# Patient Record
Sex: Female | Born: 1990 | Race: White | Hispanic: No | Smoking: Never smoker
Health system: Southern US, Community
[De-identification: ages and names within clinical notes are randomized; demographics above are authoritative.]

## PROBLEM LIST (undated history)

## (undated) ENCOUNTER — Emergency Department (HOSPITAL_COMMUNITY): Admission: EM | Payer: BC Managed Care – PPO | Source: Home / Self Care

## (undated) DIAGNOSIS — F419 Anxiety disorder, unspecified: Secondary | ICD-10-CM

## (undated) DIAGNOSIS — E039 Hypothyroidism, unspecified: Secondary | ICD-10-CM

## (undated) DIAGNOSIS — O021 Missed abortion: Secondary | ICD-10-CM

## (undated) DIAGNOSIS — F32A Depression, unspecified: Secondary | ICD-10-CM

## (undated) DIAGNOSIS — O020 Blighted ovum and nonhydatidiform mole: Secondary | ICD-10-CM

## (undated) DIAGNOSIS — Z973 Presence of spectacles and contact lenses: Secondary | ICD-10-CM

---
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
## 2019-05-16 DIAGNOSIS — O039 Complete or unspecified spontaneous abortion without complication: Secondary | ICD-10-CM | POA: Insufficient documentation

## 2019-05-16 DIAGNOSIS — N925 Other specified irregular menstruation: Secondary | ICD-10-CM | POA: Diagnosis not present

## 2019-05-27 DIAGNOSIS — O09A0 Supervision of pregnancy with history of molar pregnancy, unspecified trimester: Secondary | ICD-10-CM

## 2019-05-27 HISTORY — DX: Supervision of pregnancy with history of molar pregnancy, unspecified trimester: O09.A0

## 2019-05-31 ENCOUNTER — Other Ambulatory Visit: Payer: Self-pay | Admitting: Obstetrics and Gynecology

## 2019-05-31 DIAGNOSIS — O039 Complete or unspecified spontaneous abortion without complication: Secondary | ICD-10-CM | POA: Diagnosis not present

## 2019-05-31 DIAGNOSIS — O3680X9 Pregnancy with inconclusive fetal viability, other fetus: Secondary | ICD-10-CM | POA: Diagnosis not present

## 2019-06-01 ENCOUNTER — Encounter (HOSPITAL_BASED_OUTPATIENT_CLINIC_OR_DEPARTMENT_OTHER): Payer: Self-pay | Admitting: *Deleted

## 2019-06-01 ENCOUNTER — Other Ambulatory Visit: Payer: Self-pay

## 2019-06-01 NOTE — Progress Notes (Addendum)
Spoke w/ pt via phone for pre-op interview.   Npo after mn.  Arrive at 0530.  Pt getting covid test done 06-02-2019.  LVM for stacy, or scheduler for dr Rogue Bussing, inquired about pt's ABO/Rh result to be faxed.  ADDENDUM:  Received ABO/Rh result dated 05-16-2019 via fax from dr callahan office, place in chart.

## 2019-06-02 ENCOUNTER — Other Ambulatory Visit (HOSPITAL_COMMUNITY)
Admission: RE | Admit: 2019-06-02 | Discharge: 2019-06-02 | Disposition: A | Discharge status: Home / Self Care | Payer: Medicaid Other | Source: Ambulatory Visit | Attending: Nurse Practitioner | Admitting: Nurse Practitioner

## 2019-06-02 DIAGNOSIS — Z20828 Contact with and (suspected) exposure to other viral communicable diseases: Secondary | ICD-10-CM | POA: Insufficient documentation

## 2019-06-02 DIAGNOSIS — Z01812 Encounter for preprocedural laboratory examination: Secondary | ICD-10-CM | POA: Diagnosis not present

## 2019-06-02 LAB — SARS CORONAVIRUS 2 (TAT 6-24 HRS): SARS Coronavirus 2: NEGATIVE

## 2019-06-05 NOTE — Anesthesia Preprocedure Evaluation (Addendum)
Anesthesia Evaluation  Patient identified by MRN, date of birth, ID band Patient awake    Reviewed: Allergy & Precautions, H&P , NPO status , Patient's Chart, lab work & pertinent test results  Airway Mallampati: I  TM Distance: >3 FB Neck ROM: Full    Dental no notable dental hx. (+) Teeth Intact, Dental Advisory Given   Pulmonary neg pulmonary ROS,    Pulmonary exam normal breath sounds clear to auscultation       Cardiovascular Exercise Tolerance: Good negative cardio ROS Normal cardiovascular exam Rhythm:Regular Rate:Normal     Neuro/Psych negative neurological ROS  negative psych ROS   GI/Hepatic negative GI ROS, Neg liver ROS,   Endo/Other  negative endocrine ROS  Renal/GU negative Renal ROS  negative genitourinary   Musculoskeletal negative musculoskeletal ROS (+)   Abdominal   Peds negative pediatric ROS (+)  Hematology negative hematology ROS (+)   Anesthesia Other Findings   Reproductive/Obstetrics negative OB ROS                            Anesthesia Physical Anesthesia Plan  ASA: I  Anesthesia Plan: General   Post-op Pain Management:    Induction: Intravenous  PONV Risk Score and Plan: 3 and Ondansetron, Dexamethasone, Treatment may vary due to age or medical condition and Midazolam  Airway Management Planned: LMA and Oral ETT  Additional Equipment:   Intra-op Plan:   Post-operative Plan:   Informed Consent: I have reviewed the patients History and Physical, chart, labs and discussed the procedure including the risks, benefits and alternatives for the proposed anesthesia with the patient or authorized representative who has indicated his/her understanding and acceptance.       Plan Discussed with: CRNA, Surgeon and Anesthesiologist  Anesthesia Plan Comments: ( )       Anesthesia Quick Evaluation

## 2019-06-06 ENCOUNTER — Ambulatory Visit (HOSPITAL_BASED_OUTPATIENT_CLINIC_OR_DEPARTMENT_OTHER): Payer: Medicaid Other | Admitting: Anesthesiology

## 2019-06-06 ENCOUNTER — Encounter (HOSPITAL_BASED_OUTPATIENT_CLINIC_OR_DEPARTMENT_OTHER): Payer: Self-pay | Admitting: Emergency Medicine

## 2019-06-06 ENCOUNTER — Encounter (HOSPITAL_BASED_OUTPATIENT_CLINIC_OR_DEPARTMENT_OTHER)
Admission: RE | Disposition: A | Discharge status: Home / Self Care | Payer: Self-pay | Source: Home / Self Care | Attending: Obstetrics and Gynecology

## 2019-06-06 ENCOUNTER — Other Ambulatory Visit: Payer: Self-pay

## 2019-06-06 ENCOUNTER — Ambulatory Visit (HOSPITAL_BASED_OUTPATIENT_CLINIC_OR_DEPARTMENT_OTHER)
Admission: RE | Admit: 2019-06-06 | Discharge: 2019-06-06 | Disposition: A | Discharge status: Home / Self Care | Payer: Medicaid Other | Attending: Obstetrics and Gynecology | Admitting: Obstetrics and Gynecology

## 2019-06-06 DIAGNOSIS — O021 Missed abortion: Secondary | ICD-10-CM | POA: Insufficient documentation

## 2019-06-06 DIAGNOSIS — O039 Complete or unspecified spontaneous abortion without complication: Secondary | ICD-10-CM | POA: Diagnosis not present

## 2019-06-06 DIAGNOSIS — O0389 Complete or unspecified spontaneous abortion with other complications: Secondary | ICD-10-CM | POA: Diagnosis not present

## 2019-06-06 HISTORY — DX: Missed abortion: O02.1

## 2019-06-06 HISTORY — DX: Presence of spectacles and contact lenses: Z97.3

## 2019-06-06 HISTORY — PX: DILATION AND EVACUATION: SHX1459

## 2019-06-06 SURGERY — DILATION AND EVACUATION, UTERUS
Anesthesia: General | Site: Uterus

## 2019-06-06 MED ORDER — FENTANYL CITRATE (PF) 100 MCG/2ML IJ SOLN
25.0000 ug | INTRAMUSCULAR | Status: DC | PRN
  Filled 2019-06-06: qty 1

## 2019-06-06 MED ORDER — MIDAZOLAM HCL 5 MG/5ML IJ SOLN
INTRAMUSCULAR | Status: DC | PRN
  Administered 2019-06-06: 2 mg via INTRAVENOUS

## 2019-06-06 MED ORDER — OXYCODONE-ACETAMINOPHEN 5-325 MG PO TABS: 1.0000 | ORAL_TABLET | Freq: Four times a day (QID) | ORAL | 0 refills | Status: DC | PRN

## 2019-06-06 MED ORDER — OXYCODONE HCL 5 MG PO TABS
5.0000 mg | ORAL_TABLET | Freq: Once | ORAL | Status: AC | PRN
  Administered 2019-06-06: 5 mg via ORAL
  Filled 2019-06-06: qty 1

## 2019-06-06 MED ORDER — MIDAZOLAM HCL 2 MG/2ML IJ SOLN
INTRAMUSCULAR | Status: AC
  Filled 2019-06-06: qty 2

## 2019-06-06 MED ORDER — FENTANYL CITRATE (PF) 100 MCG/2ML IJ SOLN
INTRAMUSCULAR | Status: DC | PRN
  Administered 2019-06-06: 25 ug via INTRAVENOUS
  Administered 2019-06-06: 50 ug via INTRAVENOUS
  Administered 2019-06-06: 25 ug via INTRAVENOUS

## 2019-06-06 MED ORDER — LACTATED RINGERS IV SOLN
INTRAVENOUS | Status: DC
  Administered 2019-06-06 (×2): via INTRAVENOUS
  Filled 2019-06-06: qty 1000

## 2019-06-06 MED ORDER — ONDANSETRON HCL 4 MG/2ML IJ SOLN
INTRAMUSCULAR | Status: AC
  Filled 2019-06-06: qty 2

## 2019-06-06 MED ORDER — OXYCODONE HCL 5 MG/5ML PO SOLN
5.0000 mg | Freq: Once | ORAL | Status: AC | PRN
  Filled 2019-06-06: qty 5

## 2019-06-06 MED ORDER — LIDOCAINE 2% (20 MG/ML) 5 ML SYRINGE
INTRAMUSCULAR | Status: AC
  Filled 2019-06-06: qty 5

## 2019-06-06 MED ORDER — ARTIFICIAL TEARS OPHTHALMIC OINT
TOPICAL_OINTMENT | OPHTHALMIC | Status: AC
  Filled 2019-06-06: qty 3.5

## 2019-06-06 MED ORDER — OXYCODONE HCL 5 MG PO TABS
ORAL_TABLET | ORAL | Status: AC
  Filled 2019-06-06: qty 1

## 2019-06-06 MED ORDER — DOXYCYCLINE HYCLATE 100 MG PO TABS: 100.0000 mg | ORAL_TABLET | Freq: Once | ORAL | 0 refills | Status: AC

## 2019-06-06 MED ORDER — DEXAMETHASONE SODIUM PHOSPHATE 4 MG/ML IJ SOLN
INTRAMUSCULAR | Status: DC | PRN
  Administered 2019-06-06: 8 mg via INTRAVENOUS

## 2019-06-06 MED ORDER — KETOROLAC TROMETHAMINE 30 MG/ML IJ SOLN
INTRAMUSCULAR | Status: DC | PRN
  Administered 2019-06-06: 30 mg via INTRAVENOUS

## 2019-06-06 MED ORDER — ONDANSETRON HCL 4 MG/2ML IJ SOLN
4.0000 mg | Freq: Once | INTRAMUSCULAR | Status: DC | PRN
  Filled 2019-06-06: qty 2

## 2019-06-06 MED ORDER — OXYCODONE-ACETAMINOPHEN 5-325 MG PO TABS
1.0000 | ORAL_TABLET | Freq: Four times a day (QID) | ORAL | Status: DC | PRN
  Filled 2019-06-06: qty 2

## 2019-06-06 MED ORDER — LIDOCAINE 2% (20 MG/ML) 5 ML SYRINGE
INTRAMUSCULAR | Status: DC | PRN
  Administered 2019-06-06: 100 mg via INTRAVENOUS

## 2019-06-06 MED ORDER — ACETAMINOPHEN 325 MG PO TABS
325.0000 mg | ORAL_TABLET | ORAL | Status: DC | PRN
  Filled 2019-06-06: qty 2

## 2019-06-06 MED ORDER — DOXYCYCLINE HYCLATE 100 MG IV SOLR
200.0000 mg | Freq: Two times a day (BID) | INTRAVENOUS | Status: DC
  Administered 2019-06-06: 200 mg via INTRAVENOUS
  Filled 2019-06-06 (×2): qty 200

## 2019-06-06 MED ORDER — KETOROLAC TROMETHAMINE 30 MG/ML IJ SOLN
INTRAMUSCULAR | Status: AC
  Filled 2019-06-06: qty 1

## 2019-06-06 MED ORDER — PROPOFOL 10 MG/ML IV BOLUS
INTRAVENOUS | Status: DC | PRN
  Administered 2019-06-06: 200 mg via INTRAVENOUS

## 2019-06-06 MED ORDER — DOXYCYCLINE HYCLATE 100 MG PO TABS
100.0000 mg | ORAL_TABLET | Freq: Once | ORAL | Status: DC
  Filled 2019-06-06: qty 1

## 2019-06-06 MED ORDER — DEXAMETHASONE SODIUM PHOSPHATE 10 MG/ML IJ SOLN
INTRAMUSCULAR | Status: AC
  Filled 2019-06-06: qty 1

## 2019-06-06 MED ORDER — LIDOCAINE HCL 1 % IJ SOLN
INTRAMUSCULAR | Status: DC | PRN
  Administered 2019-06-06: 10 mL

## 2019-06-06 MED ORDER — ACETAMINOPHEN 160 MG/5ML PO SOLN
325.0000 mg | ORAL | Status: DC | PRN
  Filled 2019-06-06: qty 20.3

## 2019-06-06 MED ORDER — MEPERIDINE HCL 25 MG/ML IJ SOLN
6.2500 mg | INTRAMUSCULAR | Status: DC | PRN
  Filled 2019-06-06: qty 1

## 2019-06-06 MED ORDER — ONDANSETRON HCL 4 MG/2ML IJ SOLN
INTRAMUSCULAR | Status: DC | PRN
  Administered 2019-06-06: 4 mg via INTRAVENOUS

## 2019-06-06 MED ORDER — PROPOFOL 10 MG/ML IV BOLUS
INTRAVENOUS | Status: AC
  Filled 2019-06-06: qty 20

## 2019-06-06 MED ORDER — FENTANYL CITRATE (PF) 100 MCG/2ML IJ SOLN
INTRAMUSCULAR | Status: AC
  Filled 2019-06-06: qty 2

## 2019-06-06 SURGICAL SUPPLY — 24 items
CATH ROBINSON RED A/P 16FR (CATHETERS) ×3 IMPLANT
CLOTH BEACON ORANGE TIMEOUT ST (SAFETY) ×3 IMPLANT
COVER WAND RF STERILE (DRAPES) IMPLANT
DECANTER SPIKE VIAL GLASS SM (MISCELLANEOUS) IMPLANT
ELECT REM PT RETURN 9FT ADLT (ELECTROSURGICAL)
ELECTRODE REM PT RTRN 9FT ADLT (ELECTROSURGICAL) IMPLANT
FILTER UTR ASPR ASSEMBLY (MISCELLANEOUS) ×3 IMPLANT
GLOVE BIOGEL PI IND STRL 6.5 (GLOVE) ×1 IMPLANT
GLOVE BIOGEL PI IND STRL 7.0 (GLOVE) ×1 IMPLANT
GLOVE BIOGEL PI INDICATOR 6.5 (GLOVE) ×2
GLOVE BIOGEL PI INDICATOR 7.0 (GLOVE) ×2
GLOVE ECLIPSE 6.5 STRL STRAW (GLOVE) ×3 IMPLANT
GOWN STRL REUS W/TWL LRG LVL3 (GOWN DISPOSABLE) ×6 IMPLANT
KIT BERKELEY 1ST TRIMESTER 3/8 (MISCELLANEOUS) ×3 IMPLANT
NS IRRIG 500ML POUR BTL (IV SOLUTION) ×3 IMPLANT
PACK VAGINAL MINOR WOMEN LF (CUSTOM PROCEDURE TRAY) ×3 IMPLANT
PAD OB MATERNITY 4.3X12.25 (PERSONAL CARE ITEMS) ×3 IMPLANT
PAD PREP 24X48 CUFFED NSTRL (MISCELLANEOUS) ×3 IMPLANT
SET BERKELEY SUCTION TUBING (SUCTIONS) ×3 IMPLANT
TOWEL OR 17X26 10 PK STRL BLUE (TOWEL DISPOSABLE) ×6 IMPLANT
VACURETTE 10 RIGID CVD (CANNULA) IMPLANT
VACURETTE 7MM CVD STRL WRAP (CANNULA) IMPLANT
VACURETTE 8 RIGID CVD (CANNULA) IMPLANT
VACURETTE 9 RIGID CVD (CANNULA) ×3 IMPLANT

## 2019-06-06 NOTE — Transfer of Care (Signed)
  Last Vitals:  Vitals Value Taken Time  BP 125/91 06/06/19 0823  Temp    Pulse 90 06/06/19 0825  Resp 18 06/06/19 0825  SpO2 100 % 06/06/19 0825  Vitals shown include unvalidated device data.  Last Pain:  Vitals:   06/06/19 0602  TempSrc: Oral      Patients Stated Pain Goal: 4 (06/06/19 0602)  Immediate Anesthesia Transfer of Care Note  Patient: Betty Chung  Procedure(s) Performed: Procedure(s) (LRB): DILATATION AND EVACUATION (N/A)  Patient Location: PACU  Anesthesia Type: General  Level of Consciousness: awake, alert  and oriented  Airway & Oxygen Therapy: Patient Spontanous Breathing and Patient connected to nasal cannula oxygen  Post-op Assessment: Report given to PACU RN and Post -op Vital signs reviewed and stable  Post vital signs: Reviewed and stable  Complications: No apparent anesthesia complications

## 2019-06-06 NOTE — Discharge Instructions (Signed)
DISCHARGE INSTRUCTIONS:  D&E The following instructions have been prepared to help you care for yourself upon your return home.   Personal hygiene:  Use sanitary pads for vaginal drainage, not tampons.  Shower the day after your procedure.  NO tub baths, pools or Jacuzzis for 2-3 weeks.  Wipe front to back after using the bathroom.  Activity and limitations:  Do NOT drive or operate any equipment for 24 hours. The effects of anesthesia are still present and drowsiness may result.  Do NOT rest in bed all day.  Walking is encouraged.  Walk up and down stairs slowly.  You may resume your normal activity in one to two days or as indicated by your physician.  Sexual activity: NO intercourse for at least 2 weeks after the procedure, or as indicated by your physician.  Diet: Eat a light meal as desired this evening. You may resume your usual diet tomorrow.  Return to work: You may resume your work activities in one to two days or as indicated by your doctor.  What to expect after your surgery: Expect to have vaginal bleeding/discharge for 2-3 days and spotting for up to 10 days. It is not unusual to have soreness for up to 1-2 weeks. You may have a slight burning sensation when you urinate for the first day. Mild cramps may continue for a couple of days. You may have a regular period in 2-6 weeks.  Call your doctor for any of the following:  Excessive vaginal bleeding, saturating and changing one pad every hour.  Inability to urinate 6 hours after discharge from hospital.  Pain not relieved by pain medication.  Fever of 100.4 F or greater.  Unusual vaginal discharge or odor.    Post Anesthesia Home Care Instructions  Activity: Get plenty of rest for the remainder of the day. A responsible individual must stay with you for 24 hours following the procedure.  For the next 24 hours, DO NOT: -Drive a car -Paediatric nurse -Drink alcoholic beverages -Take any medication  unless instructed by your physician -Make any legal decisions or sign important papers.  Meals: Start with liquid foods such as gelatin or soup. Progress to regular foods as tolerated. Avoid greasy, spicy, heavy foods. If nausea and/or vomiting occur, drink only clear liquids until the nausea and/or vomiting subsides. Call your physician if vomiting continues.  Special Instructions/Symptoms: Your throat may feel dry or sore from the anesthesia or the breathing tube placed in your throat during surgery. If this causes discomfort, gargle with warm salt water. The discomfort should disappear within 24 hours.    Do not take any nonsteroidal anti inflammatories until after 4:00 pm today.

## 2019-06-06 NOTE — Op Note (Signed)
06/06/2019  8:22 AM  PATIENT:  Betty Chung  28 y.o. female  PRE-OPERATIVE DIAGNOSIS:  Miscarriage [redacted]w[redacted]d  POST-OPERATIVE DIAGNOSIS:  Miscarriage [redacted]w[redacted]d  PROCEDURE:  Procedure(s): DILATATION AND EVACUATION (N/A)  SURGEON:  Surgeon(s) and Role:    Rogue Bussing, Casimer Bilis, DO - Primary  ANESTHESIA:   local and general   FINDINGS: abundant evacuation of blood, clot and POC EBL:  400 mL   LOCAL MEDICATIONS USED:  LIDOCAINE  and Amount: 10 ml  SPECIMEN:  Source of Specimen:  RPOC  DISPOSITION OF SPECIMEN:  PATHOLOGY  COUNTS:  YES  PLAN OF CARE: Discharge to home after PACU  PATIENT DISPOSITION:  PACU - hemodynamically stable.   DESCRIPTION OF PROCEDURE: Pt was taken to the operating room where anesthesia was administered and found to be adequate.  A speculum was placed in the vagina and a single tooth tenaculum was used to grasp the anterior lip of the cervix.  The cervix was gently dilated to 29 pratt and a 9 suction curette gently advanced to fundus.  Two passed were performed with greater than expected return of blood and clot which clogged the collection system and new catchment containers needed.  A metal curette was advanced and curettage of all fur quadrants performed.  A final pass the the suction curette was performed.  No further bleeding noted from cervical os.  The tenaculum was removed and hemostasis assured.  All instruments were removed.  Pt tolerated the procedure well.   Delay start of Pharmacological VTE agent (>24hrs) due to surgical blood loss or risk of bleeding: not applicable

## 2019-06-06 NOTE — Anesthesia Postprocedure Evaluation (Signed)
Anesthesia Post Note  Patient: Betty Chung  Procedure(s) Performed: DILATATION AND EVACUATION (N/A Uterus)     Patient location during evaluation: PACU Anesthesia Type: General Level of consciousness: awake and alert Pain management: pain level controlled Vital Signs Assessment: post-procedure vital signs reviewed and stable Respiratory status: spontaneous breathing, nonlabored ventilation, respiratory function stable and patient connected to nasal cannula oxygen Cardiovascular status: blood pressure returned to baseline and stable Postop Assessment: no apparent nausea or vomiting Anesthetic complications: no    Last Vitals:  Vitals:   06/06/19 0855 06/06/19 0955  BP: 111/78 108/68  Pulse: 63 69  Resp: 13 16  Temp:  36.9 C  SpO2: 100% 100%    Last Pain:  Vitals:   06/06/19 0955  TempSrc:   PainSc: 3                  ,

## 2019-06-06 NOTE — Brief Op Note (Signed)
06/06/2019  8:16 AM  PATIENT:  Jacky Kindle  28 y.o. female  PRE-OPERATIVE DIAGNOSIS:  Miscarriage [redacted]w[redacted]d  POST-OPERATIVE DIAGNOSIS:  Miscarriage [redacted]w[redacted]d  PROCEDURE:  Procedure(s): DILATATION AND EVACUATION (N/A)  SURGEON:  Surgeon(s) and Role:    Rogue Bussing, Casimer Bilis, DO - Primary  ANESTHESIA:   local and general   FINDINGS: abundant evacuation of blood, clot and POC EBL:  400 mL   LOCAL MEDICATIONS USED:  LIDOCAINE  and Amount: 10 ml  SPECIMEN:  Source of Specimen:  RPOC  DISPOSITION OF SPECIMEN:  PATHOLOGY  COUNTS:  YES  PLAN OF CARE: Discharge to home after PACU  PATIENT DISPOSITION:  PACU - hemodynamically stable.   Delay start of Pharmacological VTE agent (>24hrs) due to surgical blood loss or risk of bleeding: not applicable

## 2019-06-06 NOTE — H&P (Signed)
28 y.o. G2P0020 at 8w3 with MAB.  At initial visit at 6 weeks, scan showed +FHT but irregular GS.  She experienced spotting thereafter. F/u scan [redacted]w[redacted]d no FHT. Options discussed, patient opted for D&E. Blood type A+.  Past Medical History:  Diagnosis Date  . Missed ab   . Wears glasses    Past Surgical History:  Procedure Laterality Date  . KNEE SURGERY Right 2007   mass removed from under patella (per pt benign)  . WISDOM TOOTH EXTRACTION  2011    Social History   Socioeconomic History  . Marital status: Single    Spouse name: Not on file  . Number of children: Not on file  . Years of education: Not on file  . Highest education level: Not on file  Occupational History  . Not on file  Social Needs  . Financial resource strain: Not on file  . Food insecurity    Worry: Not on file    Inability: Not on file  . Transportation needs    Medical: Not on file    Non-medical: Not on file  Tobacco Use  . Smoking status: Never Smoker  . Smokeless tobacco: Never Used  Substance and Sexual Activity  . Alcohol use: Not Currently  . Drug use: Never  . Sexual activity: Not on file  Lifestyle  . Physical activity    Days per week: Not on file    Minutes per session: Not on file  . Stress: Not on file  Relationships  . Social Herbalist on phone: Not on file    Gets together: Not on file    Attends religious service: Not on file    Active member of club or organization: Not on file    Attends meetings of clubs or organizations: Not on file    Relationship status: Not on file  . Intimate partner violence    Fear of current or ex partner: Not on file    Emotionally abused: Not on file    Physically abused: Not on file    Forced sexual activity: Not on file  Other Topics Concern  . Not on file  Social History Narrative  . Not on file    No current facility-administered medications on file prior to encounter.    Current Outpatient Medications on File Prior to  Encounter  Medication Sig Dispense Refill  . Prenatal Multivit-Min-Fe-FA (PRE-NATAL FORMULA PO) Take by mouth daily.      No Known Allergies  Vitals:   06/01/19 1759 06/06/19 0602  BP:  114/77  Pulse:  73  Resp:  16  Temp:  98.1 F (36.7 C)  TempSrc:  Oral  SpO2:  100%  Weight: 70.3 kg 71.7 kg  Height: 5\' 4"  (1.626 m) 5\' 4"  (1.626 m)   In office: Lungs: clear to ascultation Cor:  RRR Abdomen:  soft, nontender, nondistended. Ex:  no cords, erythema Pelvic: deferred to OR  A:  Admitted for scheduled D&E for MAB.   P: P: All risks, benefits and alternatives d/w patient and she desires to proceed.   !V doxy ordered, pt will received RX for oral dose at home post op Other routine pre-op care Allyn Kenner

## 2019-06-06 NOTE — Anesthesia Procedure Notes (Signed)
Procedure Name: LMA Insertion Date/Time: 06/06/2019 7:38 AM Performed by: Janeece Riggers, MD Pre-anesthesia Checklist: Patient identified, Emergency Drugs available, Suction available and Patient being monitored Patient Re-evaluated:Patient Re-evaluated prior to induction Oxygen Delivery Method: Circle system utilized Preoxygenation: Pre-oxygenation with 100% oxygen Induction Type: IV induction Ventilation: Mask ventilation without difficulty LMA: LMA inserted LMA Size: 4.0 Number of attempts: 1 Airway Equipment and Method: Bite block Placement Confirmation: positive ETCO2 Tube secured with: Tape Dental Injury: Teeth and Oropharynx as per pre-operative assessment

## 2019-06-07 ENCOUNTER — Encounter (HOSPITAL_BASED_OUTPATIENT_CLINIC_OR_DEPARTMENT_OTHER): Payer: Self-pay | Admitting: Obstetrics and Gynecology

## 2019-06-08 DIAGNOSIS — O020 Blighted ovum and nonhydatidiform mole: Secondary | ICD-10-CM | POA: Insufficient documentation

## 2019-06-13 DIAGNOSIS — O020 Blighted ovum and nonhydatidiform mole: Secondary | ICD-10-CM | POA: Diagnosis not present

## 2019-06-20 DIAGNOSIS — O020 Blighted ovum and nonhydatidiform mole: Secondary | ICD-10-CM | POA: Diagnosis not present

## 2019-06-27 DIAGNOSIS — O020 Blighted ovum and nonhydatidiform mole: Secondary | ICD-10-CM | POA: Diagnosis not present

## 2019-06-28 ENCOUNTER — Encounter (HOSPITAL_COMMUNITY): Payer: Self-pay | Admitting: Obstetrics and Gynecology

## 2019-07-04 DIAGNOSIS — O039 Complete or unspecified spontaneous abortion without complication: Secondary | ICD-10-CM | POA: Diagnosis not present

## 2019-07-10 ENCOUNTER — Encounter (HOSPITAL_COMMUNITY): Payer: Self-pay | Admitting: Obstetrics and Gynecology

## 2019-07-11 DIAGNOSIS — O039 Complete or unspecified spontaneous abortion without complication: Secondary | ICD-10-CM | POA: Diagnosis not present

## 2019-07-18 DIAGNOSIS — O039 Complete or unspecified spontaneous abortion without complication: Secondary | ICD-10-CM | POA: Diagnosis not present

## 2019-07-25 DIAGNOSIS — O039 Complete or unspecified spontaneous abortion without complication: Secondary | ICD-10-CM | POA: Diagnosis not present

## 2019-08-01 DIAGNOSIS — O039 Complete or unspecified spontaneous abortion without complication: Secondary | ICD-10-CM | POA: Diagnosis not present

## 2019-08-08 DIAGNOSIS — O020 Blighted ovum and nonhydatidiform mole: Secondary | ICD-10-CM | POA: Diagnosis not present

## 2019-08-15 DIAGNOSIS — O039 Complete or unspecified spontaneous abortion without complication: Secondary | ICD-10-CM | POA: Diagnosis not present

## 2019-08-22 DIAGNOSIS — O039 Complete or unspecified spontaneous abortion without complication: Secondary | ICD-10-CM | POA: Diagnosis not present

## 2019-08-29 DIAGNOSIS — O039 Complete or unspecified spontaneous abortion without complication: Secondary | ICD-10-CM | POA: Diagnosis not present

## 2019-09-05 DIAGNOSIS — O039 Complete or unspecified spontaneous abortion without complication: Secondary | ICD-10-CM | POA: Diagnosis not present

## 2019-09-08 ENCOUNTER — Other Ambulatory Visit: Payer: Self-pay | Admitting: Obstetrics and Gynecology

## 2019-09-13 ENCOUNTER — Other Ambulatory Visit (HOSPITAL_COMMUNITY): Payer: Self-pay | Admitting: Obstetrics and Gynecology

## 2019-09-13 ENCOUNTER — Other Ambulatory Visit: Payer: Self-pay | Admitting: Obstetrics and Gynecology

## 2019-09-13 DIAGNOSIS — N912 Amenorrhea, unspecified: Secondary | ICD-10-CM

## 2019-09-18 ENCOUNTER — Other Ambulatory Visit: Payer: Self-pay

## 2019-09-18 ENCOUNTER — Ambulatory Visit (HOSPITAL_COMMUNITY)
Admission: RE | Admit: 2019-09-18 | Discharge: 2019-09-18 | Disposition: A | Discharge status: Home / Self Care | Payer: Medicaid Other | Source: Ambulatory Visit | Attending: Obstetrics and Gynecology | Admitting: Obstetrics and Gynecology

## 2019-09-18 DIAGNOSIS — N912 Amenorrhea, unspecified: Secondary | ICD-10-CM | POA: Diagnosis not present

## 2019-09-20 ENCOUNTER — Telehealth: Payer: Self-pay | Admitting: *Deleted

## 2019-09-20 NOTE — Telephone Encounter (Signed)
Erline Levine from [facility] called and scheduled an appt and will contact the patient

## 2019-10-02 ENCOUNTER — Inpatient Hospital Stay: Payer: No Typology Code available for payment source | Attending: Gynecologic Oncology | Admitting: Gynecologic Oncology

## 2019-10-02 ENCOUNTER — Other Ambulatory Visit: Payer: Self-pay

## 2019-10-02 ENCOUNTER — Encounter: Payer: Self-pay | Admitting: Gynecologic Oncology

## 2019-10-02 VITALS — BP 117/68 | HR 80 | Temp 98.5°F | Resp 16 | Ht 64.0 in | Wt 163.5 lb

## 2019-10-02 DIAGNOSIS — N939 Abnormal uterine and vaginal bleeding, unspecified: Secondary | ICD-10-CM | POA: Diagnosis not present

## 2019-10-02 DIAGNOSIS — O020 Blighted ovum and nonhydatidiform mole: Secondary | ICD-10-CM | POA: Diagnosis not present

## 2019-10-02 DIAGNOSIS — I770 Arteriovenous fistula, acquired: Secondary | ICD-10-CM | POA: Diagnosis not present

## 2019-10-02 DIAGNOSIS — Q2730 Arteriovenous malformation, site unspecified: Secondary | ICD-10-CM | POA: Diagnosis not present

## 2019-10-02 DIAGNOSIS — N912 Amenorrhea, unspecified: Secondary | ICD-10-CM | POA: Diagnosis not present

## 2019-10-02 NOTE — Patient Instructions (Signed)
The pleasure meeting you today.  You can present to your OB/GYN's office anytime during business hours today or tomorrow to get your pregnancy hormone test repeated.  We will follow up with you on these results.  I will call you with the results of the MRI.  If we are unable to perform the MRI because of insurance reasons, then we will get the special pelvic ultrasound to measure the flow within the abnormal connection between the arteries and veins in your uterus.  Please not hesitate to call our clinic with any questions at [phone].

## 2019-10-02 NOTE — Progress Notes (Addendum)
GYNECOLOGIC ONCOLOGY NEW PATIENT CONSULTATION   Patient Name: Betty Chung  Patient Age: 28 y.o. 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  1. Molar pregnancy: had plateau of her hcg over 2 weeks (3 values), but she does not meet criteria for GTD. I have recommended that she get a repeat beta-hCG level, since her last level was a month ago, and would prefer that she has this done at her OB/GYN's office so that the values are consistent across the same lab.  We called the office and the patient can come in anytime during business hours to have this drawn.  She will need continued surveillance of her hCG until it normalizes.  2.  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  We reviewed what this diagnosis means and the concern for possible significant hemorrhage.  I suspect that if this is truly an AVM, it is acquired or iatrogenic secondary to her recent pregnancy and uterine procedure.  While there are a limited number of cases of uterine AVM reported in the literature, we discussed the possibility of observation or conservative treatment with embolization.  According to a recent review, peak systolic velocity within the AVM was found to be helpful in predicting its natural history, with a PSV less than or equal to 40 cm/s identifying AVMs that may be more likely to regress without intervention.  I spoke with our radiologist and unfortunately as the exam was not ordered with Doppler, this is not something they routinely assess.  I have thus recommended proceeding with a pelvic MRI to try to better characterize this  uterine lesion.  If an MRI is not covered by insurance or confirms the finding of an AVM, then we will proceed with repeat pelvic ultrasound with Doppler to assess the peak systolic velocity to help guide treatment.  3.  Birth control: I have strongly recommended that the patient use birth control as we work up this abnormal finding on her ultrasound.  She voiced understanding of the importance of using condoms if not on another form of hormonal or nonhormonal birth control.  References: Sridhar D et al. diagnosis and treatment of uterine and pelvic arteriovenous malformations.  Endovascular today.  January 2018.  A copy of this note was sent to the patient's referring provider.   Eugene Garnet, MD  Division of Gynecologic Oncology  Department of Obstetrics and Gynecology  [facility]  ___________________________________________  Chief Complaint: Chief Complaint  Patient presents with  . Molar pregnancy    History of Present Illness:  Betty Chung is a lovely 28 y.o. y.o. female who is seen in consultation at the request of Lewis Moccasin, MD for an evaluation of a possible arteriovenous fistula (AVM) of the uterus.  The patient underwent D&C for missed abortion on 06/06/2019 with final pathology showing a molar pregnancy.  She had heavy bleeding immediately after surgery and then intermittent vaginal spotting since.  She has been followed with serial hCGs, the last of which was approximately 1 month ago.  Given the plateau of hCG levels over several weeks, the patient underwent pelvic ultrasound with findings concerning for an AVM.  The patient overall is  doing well.  Her last menstrual period was the week before Thanksgiving.  She notes that since her D&C, her menses have been and unchanged in terms of length and quantity of bleeding compared to prior to her most recent pregnancy.  Her last period was more painful; she notes that typically she has  cramping requiring the use of Midol for 1 to 2 days at the start of her menses but this most recent menses used Midol for the entire duration of bleeding.  She is currently sexually active since her procedure and is not using birth control.  She strongly desires to maintain fertility.  PAST MEDICAL HISTORY:  Past Medical History:  Diagnosis Date  . Missed ab   . Molar pregnancy   . Wears glasses      PAST SURGICAL HISTORY:  Past Surgical History:  Procedure Laterality Date  . DILATION AND EVACUATION N/A 06/06/2019   Procedure: DILATATION AND EVACUATION;  Surgeon: Allyn Kenner, DO;  Location: [facility];  Service: Gynecology;  Laterality: N/A;  . KNEE SURGERY Right 2007   mass removed from under patella (per pt benign)  . WISDOM TOOTH EXTRACTION  2011    OB/GYN HISTORY:  OB History  Gravida Para Term Preterm AB Living  2 0     2    SAB TAB Ectopic Multiple Live Births               # Outcome Date GA Lbr Len/2nd Weight Sex Delivery Anes PTL Lv  2 AB           1 AB             Obstetric Comments  Last pap: 10/26/16  Hx of abnormal pap: yes, in 2017, repeat normal  History of STIs: denies  Age at menarche: 79    Last LMP: 11/11 Used Sprintec pills previously but has not in years  MEDICATIONS: Outpatient Encounter Medications as of 10/02/2019  Medication Sig  . Prenatal Multivit-Min-Fe-FA (PRE-NATAL FORMULA PO) Take by mouth daily.  . [DISCONTINUED] oxyCODONE-acetaminophen (PERCOCET/ROXICET) 5-325 MG tablet Take 1-2 tablets by mouth every 6 (six) hours as needed for severe pain.   No facility-administered encounter medications on file as of 10/02/2019.     ALLERGIES:  Allergies  Allergen Reactions  . No Allergies On File      FAMILY HISTORY:  Family History  Problem Relation Age of Onset  . Uterine cancer Paternal Grandmother      SOCIAL HISTORY:  Relationships  Social connections  . Talks on phone: Not on file  . Gets together: Not on file   . Attends religious service: Not on file  . Active member of club or organization: Not on file  . Attends meetings of clubs or organizations: Not on file  . Relationship status: Not on file    REVIEW OF SYSTEMS:  Denies appetite changes, fevers, chills, fatigue, unexplained weight changes. Denies hearing loss, neck lumps or masses, mouth sores, ringing in ears or voice changes. Denies cough or wheezing.  Denies shortness of breath. Denies chest pain or palpitations. Denies leg swelling. Denies abdominal distention, pain, blood in stools, constipation, diarrhea, nausea, vomiting, or early satiety. Denies pain with intercourse, dysuria, frequency, hematuria or incontinence. Denies hot flashes, pelvic pain, vaginal bleeding or vaginal discharge.   Denies joint pain, back pain or muscle pain/cramps. Denies itching, rash, or wounds. Denies dizziness, headaches, numbness or seizures. Denies swollen lymph nodes or glands, denies easy bruising or bleeding. Denies anxiety,  depression, confusion, or decreased concentration.   Physical Exam:  Vital Signs for this encounter:  Blood pressure 117/68, pulse 80, temperature 98.5 F (36.9 C), temperature source Temporal, resp. rate 16, height 5\' 4"  (1.626 m), weight 163 lb 8 oz (74.2 kg), SpO2 100 %. Body mass index is 28.06 kg/m. General: Alert, oriented, no acute distress.  HEENT: Normocephalic, atraumatic. Sclera anicteric.  Chest: Clear to auscultation bilaterally.  Cardiovascular: Regular rate and rhythm, no murmurs, rubs, or gallops.  Abdomen: Normoactive bowel sounds. Soft, nondistended, nontender to palpation. No masses or hepatosplenomegaly appreciated. No palpable fluid wave.  Extremities: Grossly normal range of motion. Warm, well perfused. No edema bilaterally.  Skin: No rashes or lesions.  Lymphatics: No cervical, supraclavicular, or inguinal adenopathy.  GU:  Normal external female genitalia.   No lesions. No discharge or bleeding.              Bladder/urethra:  No lesions or masses, well supported bladder             Vagina: Rugated vaginal mucosa, no lesions or masses.             Cervix: Normal appearing, no lesions.             Uterus: Small, mobile, no nodularity.             Adnexa: No masses.   LABORATORY AND RADIOLOGIC DATA:  Outside medical records were reviewed to synthesize the above history, along with the history and physical obtained during the visit.   Pathology from products of conception, 8/11: Chorionic villi with features of molar gestation, favor complete  Pelvic ultrasound on 09/18/2019: FINDINGS: Uterus Measurements: 9.9 x 3.2 x 4.6 cm = volume: 75.4 mL. Heterogeneous somewhat tubular and echogenic area within the left anterior uterine wall measuring 1.5 x 1.3 x 1.4 cm, containing prominent vascularity. Endometrium Thickness: 6.8 mm.  No focal abnormality visualized. Right ovary Measurements: 2.3 x 1.1 x 1.6 cm = volume: 2.1 mL. Normal appearance/no adnexal mass. Left ovary Measurements: 3.3 x 2.3 x 2.6 cm = volume: 10.5 mL. Normal appearance/no adnexal mass. Other findings:  No abnormal free fluid. IMPRESSION: 1. Heterogenous slightly echogenic area of altered echotexture within the left anterior uterus measuring up to 1.5 cm and containing significant vascularity. 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 Further evaluation with pelvic MRI is recommended. 2. The adnexa are within normal limits.  Labs: hcg 11/11: 13 11/4: 14 10/28: 15 10/21: 23 10/14: 23 10/7: 31 9/30: 66 9/23: 86 9/16: 147 9/9: 210 9/2: 258 8/26: 343 8/19: 1451

## 2019-10-04 ENCOUNTER — Encounter: Payer: Self-pay | Admitting: Gynecologic Oncology

## 2019-10-04 ENCOUNTER — Telehealth: Payer: Self-pay | Admitting: Gynecologic Oncology

## 2019-10-04 NOTE — Telephone Encounter (Signed)
Called the patient to review results from her beta-hCG at outside facility yesterday.  Her hCG level was 12.  She has now had plateau of her hCG over 4 values and about a month and a half, meeting criteria for gestational trophoblastic disease.  We discussed this diagnosis and typical treatment.  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  We discussed treatment options including dilation and curettage and chemotherapy.  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  I think it would be reasonable to continue close surveillance of her hCG and delay intervention unless her hCG value starts climbing versus treat with chemotherapy.  We discussed the use of methotrexate (would plan 5-day regimen) and the likely short treatment course given her already low hCG and what I assume would be a very quick normalization of this level.  We discussed expectations for surveillance after treatment as well as possibility for future pregnancy.  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  All the patient's questions were answered.  I sent information to her via MyChart regarding GTD as well as methotrexate for her review.  Her mutually decided plan was that I will call her with the MRI results which is scheduled for the 15th and we will decide at that point whether she would like to continue close surveillance or start methotrexate treatment.  Jeral Pinch MD Gynecologic Oncology

## 2019-10-10 ENCOUNTER — Other Ambulatory Visit: Payer: Self-pay

## 2019-10-10 ENCOUNTER — Ambulatory Visit (HOSPITAL_COMMUNITY)
Admission: RE | Admit: 2019-10-10 | Discharge: 2019-10-10 | Disposition: A | Discharge status: Home / Self Care | Payer: Medicaid Other | Source: Ambulatory Visit | Attending: Gynecologic Oncology | Admitting: Gynecologic Oncology

## 2019-10-10 DIAGNOSIS — N939 Abnormal uterine and vaginal bleeding, unspecified: Secondary | ICD-10-CM | POA: Insufficient documentation

## 2019-10-10 DIAGNOSIS — N83202 Unspecified ovarian cyst, left side: Secondary | ICD-10-CM | POA: Diagnosis not present

## 2019-10-10 MED ORDER — GADOBUTROL 1 MMOL/ML IV SOLN
7.0000 mL | Freq: Once | INTRAVENOUS | Status: AC | PRN
  Administered 2019-10-10: 7 mL via INTRAVENOUS

## 2019-10-11 ENCOUNTER — Telehealth: Payer: Self-pay | Admitting: Gynecologic Oncology

## 2019-10-11 DIAGNOSIS — O020 Blighted ovum and nonhydatidiform mole: Secondary | ICD-10-CM | POA: Diagnosis not present

## 2019-10-11 NOTE — Telephone Encounter (Signed)
Called and reviewed MRI findings with the patient.  Discussed no AVM on MRI and fundal area on the uterus likely adenomyosis versus small fibroid.  Also discussed benign-appearing ovarian cyst.  Reviewed treatment options including close surveillance of hCG versus chemotherapy at this time.  I think it would be reasonable now to also consider a D&C given no AVM.  I spoke with multiple colleagues regarding the patient's case and consensus was for erring on the side of close surveillance and trending beta-hCG weekly.  If hCG starts rising, then at this point would consider D&C and/or chemotherapy with methotrexate.  Discussed risks and benefits of each plan and patient voices being comfortable with decision to continue with close surveillance.  My office will call her primary GYN's office to let them know she will come in weekly on Wednesday, which is convenient for her before work, to get a hCG drawn.  We will ask them to fax [place] these results.  Jeral Pinch MD Gynecologic Oncology

## 2019-10-12 ENCOUNTER — Telehealth: Payer: Self-pay | Admitting: Gynecologic Oncology

## 2019-10-12 NOTE — Telephone Encounter (Signed)
Received fax with hCG value from 12/16, which was 5.  Called Ziyana with this news.  Since this is technically within the normal range, I discussed following hCG levels weekly until 3 normal or negative values and then transitioning to monthly checks likely for total of 6 months before she would be released to try to get pregnant again.  Jeral Pinch MD Gynecologic Oncology

## 2019-10-18 DIAGNOSIS — O020 Blighted ovum and nonhydatidiform mole: Secondary | ICD-10-CM | POA: Diagnosis not present

## 2019-10-23 ENCOUNTER — Encounter: Payer: Self-pay | Admitting: Gynecologic Oncology

## 2019-10-25 DIAGNOSIS — O020 Blighted ovum and nonhydatidiform mole: Secondary | ICD-10-CM | POA: Diagnosis not present

## 2019-10-26 ENCOUNTER — Encounter: Payer: Self-pay | Admitting: Gynecologic Oncology

## 2019-11-16 ENCOUNTER — Encounter: Payer: Self-pay | Admitting: Gynecologic Oncology

## 2019-12-14 ENCOUNTER — Encounter: Payer: Self-pay | Admitting: Gynecologic Oncology

## 2020-01-10 ENCOUNTER — Encounter: Payer: Self-pay | Admitting: Gynecologic Oncology

## 2020-01-12 ENCOUNTER — Encounter: Payer: Self-pay | Admitting: Gynecologic Oncology

## 2020-02-08 ENCOUNTER — Encounter: Payer: Self-pay | Admitting: Gynecologic Oncology

## 2020-03-14 ENCOUNTER — Encounter: Payer: Self-pay | Admitting: Gynecologic Oncology

## 2020-04-11 ENCOUNTER — Encounter: Payer: Self-pay | Admitting: Gynecologic Oncology

## 2020-05-09 ENCOUNTER — Encounter: Payer: Self-pay | Admitting: Gynecologic Oncology

## 2020-05-13 DIAGNOSIS — N925 Other specified irregular menstruation: Secondary | ICD-10-CM | POA: Diagnosis not present

## 2020-05-16 DIAGNOSIS — N925 Other specified irregular menstruation: Secondary | ICD-10-CM | POA: Diagnosis not present

## 2020-05-17 ENCOUNTER — Encounter: Payer: Self-pay | Admitting: Gynecologic Oncology

## 2020-05-17 DIAGNOSIS — O26841 Uterine size-date discrepancy, first trimester: Secondary | ICD-10-CM | POA: Diagnosis not present

## 2020-05-20 ENCOUNTER — Encounter: Payer: Self-pay | Admitting: Gynecologic Oncology

## 2020-05-31 ENCOUNTER — Encounter: Payer: Self-pay | Admitting: Gynecologic Oncology

## 2020-05-31 DIAGNOSIS — O3680X9 Pregnancy with inconclusive fetal viability, other fetus: Secondary | ICD-10-CM | POA: Diagnosis not present

## 2020-05-31 DIAGNOSIS — Z124 Encounter for screening for malignant neoplasm of cervix: Secondary | ICD-10-CM | POA: Diagnosis not present

## 2020-06-04 DIAGNOSIS — N925 Other specified irregular menstruation: Secondary | ICD-10-CM | POA: Diagnosis not present

## 2020-06-13 DIAGNOSIS — O3680X9 Pregnancy with inconclusive fetal viability, other fetus: Secondary | ICD-10-CM | POA: Diagnosis not present

## 2020-06-13 DIAGNOSIS — O021 Missed abortion: Secondary | ICD-10-CM | POA: Diagnosis not present

## 2020-06-14 ENCOUNTER — Other Ambulatory Visit (HOSPITAL_COMMUNITY)
Admission: RE | Admit: 2020-06-14 | Discharge: 2020-06-14 | Disposition: A | Discharge status: Home / Self Care | Payer: Medicaid Other | Source: Ambulatory Visit | Attending: Obstetrics and Gynecology | Admitting: Obstetrics and Gynecology

## 2020-06-14 ENCOUNTER — Other Ambulatory Visit: Payer: Self-pay

## 2020-06-14 ENCOUNTER — Encounter (HOSPITAL_BASED_OUTPATIENT_CLINIC_OR_DEPARTMENT_OTHER): Payer: Self-pay | Admitting: Obstetrics and Gynecology

## 2020-06-14 DIAGNOSIS — Z01812 Encounter for preprocedural laboratory examination: Secondary | ICD-10-CM | POA: Diagnosis not present

## 2020-06-14 DIAGNOSIS — Z20822 Contact with and (suspected) exposure to covid-19: Secondary | ICD-10-CM | POA: Insufficient documentation

## 2020-06-14 LAB — SARS CORONAVIRUS 2 (TAT 6-24 HRS): SARS Coronavirus 2: NEGATIVE

## 2020-06-14 NOTE — Progress Notes (Signed)
Spoke w/ via phone for pre-op interview--- PT Lab needs dos----  CBC, T&S             Lab results------ previous ABO/Rh results w/ chart COVID test ------ 06-14-2020 @ 1415 Arrive at ------- 1045 NPO after MN NO Solid Food.  Clear liquids from MN until--- 0945 Medications to take morning of surgery ----- NONE Diabetic medication ----- n/a Patient Special Instructions ----- n/a Pre-Op special Istructions ----- n/a Patient verbalized understanding of instructions that were given at this phone interview. Patient denies shortness of breath, chest pain, fever, cough at this phone interview.

## 2020-06-14 NOTE — H&P (Addendum)
29 y.o. G2P0020 presents for scheduled D&E for MAB.  Pt had MAB at 6 weeks 01/2019, then 05/2019: another MAB with D&E POC showing chorionic villi with molar features- suspected partial mole. Recommended  weekly BHCG until normal for 3 weeks, then monthly x 1 reliable BC for of treatment period.  Plateau of BHCG noted on 13th lab draw. Discussed with gyn-onc 08/30/2019: Dr. Andrey Farmer advised ok to repeat BHCG for 2 additional weeks, if still in low teens would recommend repeat US if + products would repeat D&C if neg can still do D&C and option of referral to her.  US showing hypervasculaer 1.5cm heterogenous tubular area within Left anterior wall measuring 1.5cm in largest dimension. Pt then saw Dr. Pricilla Holm 10/02/2019 and recommendation was continued following of BHCG: then 5, 2, <1, <1, <1, <1, <1, <1, the 05/09/2020: 34, 554 and 1312. [place] requested by gyn onc, initiall [redacted]w[redacted]d FHT @108 , f/u scan [redacted]w[redacted]d No FHT.  Past Medical History:  Diagnosis Date  . Missed ab   . Molar pregnancy   . Wears glasses    Past Surgical History:  Procedure Laterality Date  . DILATION AND EVACUATION N/A 06/06/2019   Procedure: DILATATION AND EVACUATION;  Surgeon: 08/06/2019, DO;  Location: [facility];  Service: Gynecology;  Laterality: N/A;  . KNEE SURGERY Right 2007   mass removed from under patella (per pt benign)  . WISDOM TOOTH EXTRACTION  2011    Social History   Socioeconomic History  . Marital status: Single    Spouse name: Not on file  . Number of children: Not on file  . Years of education: Not on file  . Highest education level: Not on file  Occupational History  . Not on file  Tobacco Use  . Smoking status: Never Smoker  . Smokeless tobacco: Never Used  Vaping Use  . Vaping Use: Never used  Substance and Sexual Activity  . Alcohol use: Not Currently  . Drug use: Never  . Sexual activity: Yes    Birth control/protection: None  Other Topics Concern  . Not on file  Social History  Narrative  . Not on file   Social Determinants of Health   Financial Resource Strain:   . Difficulty of Paying Living Expenses: Not on file  Food Insecurity:   . Worried About 2012 in the Last Year: Not on file  . Ran Out of Food in the Last Year: Not on file  Transportation Needs:   . Lack of Transportation (Medical): Not on file  . Lack of Transportation (Non-Medical): Not on file  Physical Activity:   . Days of Exercise per Week: Not on file  . Minutes of Exercise per Session: Not on file  Stress:   . Feeling of Stress : Not on file  Social Connections:   . Frequency of Communication with Friends and Family: Not on file  . Frequency of Social Gatherings with Friends and Family: Not on file  . Attends Religious Services: Not on file  . Active Member of Clubs or Organizations: Not on file  . Attends Programme researcher, broadcasting/film/video Meetings: Not on file  . Marital Status: Not on file  Intimate Partner Violence:   . Fear of Current or Ex-Partner: Not on file  . Emotionally Abused: Not on file  . Physically Abused: Not on file  . Sexually Abused: Not on file    No current facility-administered medications on file prior to encounter.   Current Outpatient Medications  on File Prior to Encounter  Medication Sig Dispense Refill  . Prenatal Multivit-Min-Fe-FA (PRE-NATAL FORMULA PO) Take by mouth daily.      Allergies  Allergen Reactions  . No Allergies On File     There were no vitals filed for this visit.  Lungs: clear to ascultation Cor:  RRR Abdomen:  soft, nontender, nondistended. Ex:  no cords, erythema Pelvic:  Deferred to OR  A:  MAB [redacted]w[redacted]d with recent h.o molar pregnancy 2020, did not complete surveillance course prior to becoming pregnant.   P:  All risks, benefits and alternatives d/w patient and she desires to proceed with D&E. IV doxy pre-op SCD T&S with CBC Other routine care  Philip Aspen   There has been no change in the patients history,  status or exam since the history and physical.  Vitals:   06/14/20 1050 06/18/20 1134  BP:  106/66  Resp:  20  Temp:  97.9 F (36.6 C)  TempSrc:  Oral  SpO2:  96%  Weight: 71.7 kg 72.5 kg  Height: 5\' 4"  (1.626 m) 5\' 4"  (1.626 m)    No results found for this or any previous visit (from the past 72 hour(s)).  

## 2020-06-18 ENCOUNTER — Encounter (HOSPITAL_BASED_OUTPATIENT_CLINIC_OR_DEPARTMENT_OTHER)
Admission: RE | Disposition: A | Discharge status: Home / Self Care | Payer: Self-pay | Source: Home / Self Care | Attending: Obstetrics and Gynecology

## 2020-06-18 ENCOUNTER — Other Ambulatory Visit: Payer: Self-pay

## 2020-06-18 ENCOUNTER — Ambulatory Visit (HOSPITAL_BASED_OUTPATIENT_CLINIC_OR_DEPARTMENT_OTHER): Payer: Medicaid Other | Admitting: Anesthesiology

## 2020-06-18 ENCOUNTER — Encounter (HOSPITAL_BASED_OUTPATIENT_CLINIC_OR_DEPARTMENT_OTHER): Payer: Self-pay | Admitting: Obstetrics and Gynecology

## 2020-06-18 ENCOUNTER — Ambulatory Visit (HOSPITAL_BASED_OUTPATIENT_CLINIC_OR_DEPARTMENT_OTHER)
Admission: RE | Admit: 2020-06-18 | Discharge: 2020-06-18 | Disposition: A | Discharge status: Home / Self Care | Payer: Medicaid Other | Attending: Obstetrics and Gynecology | Admitting: Obstetrics and Gynecology

## 2020-06-18 DIAGNOSIS — O021 Missed abortion: Secondary | ICD-10-CM | POA: Diagnosis not present

## 2020-06-18 DIAGNOSIS — Z8759 Personal history of other complications of pregnancy, childbirth and the puerperium: Secondary | ICD-10-CM | POA: Insufficient documentation

## 2020-06-18 HISTORY — PX: HYSTEROSCOPY WITH D & C: SHX1775

## 2020-06-18 LAB — TYPE AND SCREEN
ABO/RH(D): A POS
Antibody Screen: NEGATIVE

## 2020-06-18 LAB — CBC
HCT: 38.7 % (ref 36.0–46.0)
Hemoglobin: 13.3 g/dL (ref 12.0–15.0)
MCH: 30.3 pg (ref 26.0–34.0)
MCHC: 34.4 g/dL (ref 30.0–36.0)
MCV: 88.2 fL (ref 80.0–100.0)
Platelets: 228 10*3/uL (ref 150–400)
RBC: 4.39 MIL/uL (ref 3.87–5.11)
RDW: 12.2 % (ref 11.5–15.5)
WBC: 6.5 10*3/uL (ref 4.0–10.5)
nRBC: 0 % (ref 0.0–0.2)

## 2020-06-18 LAB — ABO/RH: ABO/RH(D): A POS

## 2020-06-18 SURGERY — DILATATION AND CURETTAGE /HYSTEROSCOPY
Anesthesia: General | Site: Vagina

## 2020-06-18 MED ORDER — ONDANSETRON HCL 4 MG/2ML IJ SOLN
INTRAMUSCULAR | Status: AC
  Filled 2020-06-18: qty 2

## 2020-06-18 MED ORDER — OXYCODONE HCL 5 MG PO TABS
5.0000 mg | ORAL_TABLET | Freq: Once | ORAL | Status: AC | PRN
  Administered 2020-06-18: 5 mg via ORAL

## 2020-06-18 MED ORDER — FENTANYL CITRATE (PF) 100 MCG/2ML IJ SOLN
INTRAMUSCULAR | Status: AC
  Filled 2020-06-18: qty 2

## 2020-06-18 MED ORDER — DEXAMETHASONE SODIUM PHOSPHATE 10 MG/ML IJ SOLN
INTRAMUSCULAR | Status: DC | PRN
  Administered 2020-06-18: 8 mg via INTRAVENOUS

## 2020-06-18 MED ORDER — LIDOCAINE 2% (20 MG/ML) 5 ML SYRINGE
INTRAMUSCULAR | Status: AC
  Filled 2020-06-18: qty 5

## 2020-06-18 MED ORDER — LIDOCAINE HCL 1 % IJ SOLN
INTRAMUSCULAR | Status: DC | PRN
  Administered 2020-06-18: 600 mg via INTRADERMAL

## 2020-06-18 MED ORDER — DOXYCYCLINE HYCLATE 100 MG PO TABS: 100.0000 mg | ORAL_TABLET | Freq: Once | ORAL | Status: DC

## 2020-06-18 MED ORDER — DEXAMETHASONE SODIUM PHOSPHATE 10 MG/ML IJ SOLN
INTRAMUSCULAR | Status: AC
  Filled 2020-06-18: qty 1

## 2020-06-18 MED ORDER — OXYCODONE HCL 5 MG PO TABS
ORAL_TABLET | ORAL | Status: AC
  Filled 2020-06-18: qty 1

## 2020-06-18 MED ORDER — MIDAZOLAM HCL 5 MG/5ML IJ SOLN
INTRAMUSCULAR | Status: DC | PRN
  Administered 2020-06-18: 2 mg via INTRAVENOUS

## 2020-06-18 MED ORDER — DOXYCYCLINE HYCLATE 100 MG PO TABS: ORAL_TABLET | ORAL | 0 refills | Status: DC

## 2020-06-18 MED ORDER — FENTANYL CITRATE (PF) 100 MCG/2ML IJ SOLN: 25.0000 ug | INTRAMUSCULAR | Status: DC | PRN

## 2020-06-18 MED ORDER — LIDOCAINE HCL 1 % IJ SOLN
INTRAMUSCULAR | Status: DC | PRN
  Administered 2020-06-18: 9 mL

## 2020-06-18 MED ORDER — FENTANYL CITRATE (PF) 100 MCG/2ML IJ SOLN
INTRAMUSCULAR | Status: DC | PRN
  Administered 2020-06-18: 25 ug via INTRAVENOUS
  Administered 2020-06-18: 50 ug via INTRAVENOUS
  Administered 2020-06-18: 25 ug via INTRAVENOUS

## 2020-06-18 MED ORDER — ONDANSETRON HCL 4 MG/2ML IJ SOLN
INTRAMUSCULAR | Status: DC | PRN
  Administered 2020-06-18: 4 mg via INTRAVENOUS

## 2020-06-18 MED ORDER — SODIUM CHLORIDE 0.9 % IV SOLN
100.0000 mg | Freq: Once | INTRAVENOUS | Status: AC
  Administered 2020-06-18: 100 mg via INTRAVENOUS
  Filled 2020-06-18 (×2): qty 100

## 2020-06-18 MED ORDER — LACTATED RINGERS IV SOLN: INTRAVENOUS | Status: DC

## 2020-06-18 MED ORDER — ACETAMINOPHEN 500 MG PO TABS: 1000.0000 mg | ORAL_TABLET | Freq: Once | ORAL | Status: DC | PRN

## 2020-06-18 MED ORDER — ACETAMINOPHEN 10 MG/ML IV SOLN: 1000.0000 mg | Freq: Once | INTRAVENOUS | Status: DC | PRN

## 2020-06-18 MED ORDER — ACETAMINOPHEN 160 MG/5ML PO SOLN: 1000.0000 mg | Freq: Once | ORAL | Status: DC | PRN

## 2020-06-18 MED ORDER — PROPOFOL 10 MG/ML IV BOLUS
INTRAVENOUS | Status: AC
  Filled 2020-06-18: qty 20

## 2020-06-18 MED ORDER — OXYCODONE HCL 5 MG/5ML PO SOLN: 5.0000 mg | Freq: Once | ORAL | Status: AC | PRN

## 2020-06-18 MED ORDER — PROPOFOL 10 MG/ML IV BOLUS
INTRAVENOUS | Status: DC | PRN
  Administered 2020-06-18: 200 mg via INTRAVENOUS

## 2020-06-18 MED ORDER — MIDAZOLAM HCL 2 MG/2ML IJ SOLN
INTRAMUSCULAR | Status: AC
  Filled 2020-06-18: qty 2

## 2020-06-18 SURGICAL SUPPLY — 25 items
CATH ROBINSON RED A/P 16FR (CATHETERS) ×3 IMPLANT
CLOTH BEACON ORANGE TIMEOUT ST (SAFETY) ×3 IMPLANT
COVER WAND RF STERILE (DRAPES) ×3 IMPLANT
DECANTER SPIKE VIAL GLASS SM (MISCELLANEOUS) ×3 IMPLANT
ELECT REM PT RETURN 9FT ADLT (ELECTROSURGICAL)
ELECTRODE REM PT RTRN 9FT ADLT (ELECTROSURGICAL) IMPLANT
FILTER UTR ASPR ASSEMBLY (MISCELLANEOUS) ×3 IMPLANT
GLOVE BIOGEL PI IND STRL 6.5 (GLOVE) ×1 IMPLANT
GLOVE BIOGEL PI IND STRL 7.0 (GLOVE) ×1 IMPLANT
GLOVE BIOGEL PI INDICATOR 6.5 (GLOVE) ×2
GLOVE BIOGEL PI INDICATOR 7.0 (GLOVE) ×2
GLOVE ECLIPSE 6.5 STRL STRAW (GLOVE) ×3 IMPLANT
GOWN STRL REUS W/TWL LRG LVL3 (GOWN DISPOSABLE) ×3 IMPLANT
KIT BERKELEY 1ST TRI 3/8 NO TR (MISCELLANEOUS) ×3 IMPLANT
KIT BERKELEY 1ST TRIMESTER 3/8 (MISCELLANEOUS) ×3 IMPLANT
NS IRRIG 500ML POUR BTL (IV SOLUTION) ×3 IMPLANT
PACK VAGINAL MINOR WOMEN LF (CUSTOM PROCEDURE TRAY) ×3 IMPLANT
PAD OB MATERNITY 4.3X12.25 (PERSONAL CARE ITEMS) ×3 IMPLANT
PAD PREP 24X48 CUFFED NSTRL (MISCELLANEOUS) ×3 IMPLANT
SET BERKELEY SUCTION TUBING (SUCTIONS) ×3 IMPLANT
TOWEL OR 17X26 10 PK STRL BLUE (TOWEL DISPOSABLE) ×3 IMPLANT
VACURETTE 10 RIGID CVD (CANNULA) IMPLANT
VACURETTE 7MM CVD STRL WRAP (CANNULA) ×3 IMPLANT
VACURETTE 8 RIGID CVD (CANNULA) IMPLANT
VACURETTE 9 RIGID CVD (CANNULA) IMPLANT

## 2020-06-18 NOTE — Anesthesia Procedure Notes (Signed)
Procedure Name: LMA Insertion Date/Time: 06/18/2020 12:47 PM Performed by: Garth Bigness, CRNA Pre-anesthesia Checklist: Patient identified, Emergency Drugs available, Suction available, Patient being monitored and Timeout performed Patient Re-evaluated:Patient Re-evaluated prior to induction Oxygen Delivery Method: Circle system utilized Preoxygenation: Pre-oxygenation with 100% oxygen Induction Type: IV induction Ventilation: Mask ventilation without difficulty LMA: LMA inserted LMA Size: 4.0 Number of attempts: 1 Placement Confirmation: positive ETCO2 Tube secured with: Tape Dental Injury: Teeth and Oropharynx as per pre-operative assessment

## 2020-06-18 NOTE — Progress Notes (Signed)
Patient awake, alert and oriented after procedure. Dr Rogue Bussing had discussed the addition of chromosomal studies prior to surgery according to the patient. Specimen was collected in OR. Anora kit consent signed in phase 2 by patient. Copy on chart.

## 2020-06-18 NOTE — Anesthesia Procedure Notes (Signed)
Performed by: Flynn-Cook,  A, CRNA       

## 2020-06-18 NOTE — Transfer of Care (Signed)
Immediate Anesthesia Transfer of Care Note  Patient: Betty Chung  Procedure(s) Performed: DILATATION AND CURETTAGE /HYSTEROSCOPY (N/A )  Patient Location: PACU  Anesthesia Type:General  Level of Consciousness: awake, alert , oriented and patient cooperative  Airway & Oxygen Therapy: Patient Spontanous Breathing and Patient connected to nasal cannula oxygen  Post-op Assessment: Report given to RN and Post -op Vital signs reviewed and stable  Post vital signs: Reviewed and stable  Last Vitals:  Vitals Value Taken Time  BP 116/70 06/18/20 1320  Temp    Pulse 78 06/18/20 1320  Resp 16 06/18/20 1322  SpO2 100 % 06/18/20 1320  Vitals shown include unvalidated device data.  Last Pain:  Vitals:   06/18/20 1134  TempSrc: Oral  PainSc: 2       Patients Stated Pain Goal: 6 (06/18/20 1134)  Complications: No complications documented.

## 2020-06-18 NOTE — Anesthesia Preprocedure Evaluation (Addendum)
Anesthesia Evaluation  Patient identified by MRN, date of birth, ID band Patient awake    Reviewed: Allergy & Precautions, NPO status , Patient's Chart, lab work & pertinent test results  History of Anesthesia Complications Negative for: history of anesthetic complications  Airway Mallampati: I  TM Distance: >3 FB Neck ROM: Full    Dental  (+) Dental Advisory Given, Teeth Intact   Pulmonary neg pulmonary ROS, neg recent URI,  Covid-19 Nucleic Acid Test Results Lab Results      Component                Value               Date                      SARSCOV2NAA              NEGATIVE            06/14/2020                SARSCOV2NAA              NEGATIVE            06/02/2019              breath sounds clear to auscultation       Cardiovascular negative cardio ROS   Rhythm:Regular     Neuro/Psych negative neurological ROS  negative psych ROS   GI/Hepatic negative GI ROS, Neg liver ROS,   Endo/Other  negative endocrine ROS  Renal/GU negative Renal ROS     Musculoskeletal negative musculoskeletal ROS (+)   Abdominal   Peds  Hematology negative hematology ROS (+)   Anesthesia Other Findings   Reproductive/Obstetrics                            Anesthesia Physical Anesthesia Plan  ASA: I  Anesthesia Plan: General   Post-op Pain Management:    Induction: Intravenous  PONV Risk Score and Plan: 3 and Ondansetron and Dexamethasone  Airway Management Planned: LMA and Oral ETT  Additional Equipment: None  Intra-op Plan:   Post-operative Plan:   Informed Consent: I have reviewed the patients History and Physical, chart, labs and discussed the procedure including the risks, benefits and alternatives for the proposed anesthesia with the patient or authorized representative who has indicated his/her understanding and acceptance.     Dental advisory given  Plan Discussed with: CRNA and  Surgeon  Anesthesia Plan Comments:         Anesthesia Quick Evaluation

## 2020-06-18 NOTE — Discharge Instructions (Signed)

## 2020-06-18 NOTE — Op Note (Signed)
06/18/2020  1:48 PM  PATIENT:  Betty Chung  29 y.o. female  PRE-OPERATIVE DIAGNOSIS:  Missed abortion 6wk  POST-OPERATIVE DIAGNOSIS:  Missed abortion 6wk  PROCEDURE:  Procedure(s): DILATATION AND EVACUATION (N/A) CHROMOSOME STUDIES (N/A)  SURGEON:  Surgeon(s) and Role:    Rogue Bussing, , DO - Primary   ANESTHESIA:   local and MAC  EBL:  10 mL   BLOOD ADMINISTERED:none  LOCAL MEDICATIONS USED:  LIDOCAINE  and Amount: 9 ml  DESCRIPTION OF PROCEDURE: Patient was taken to the OR where anesthesia was administered and found to be adequate.  Patient was prepped and draped in normal sterile fashion in dorsal lithotomy position.  A speculum was placed and anterior lip of cervix grasped with single tooth tenaculum.  Lidocaine paracervical block was placed and cervix was serially dilated to accommodate a curved 20m suction curette.  It was gently advanced to the fundus.  Proper suction confirmed and two passes performed with moderate POC evacuated.  A final pass with sharp curette to confirm complete evacuation was performed with no additional products obtained.  Tenaculum was removed and excellent hemostasis at tenaculum sites and cervical os noted.  All instruments were removed.  Patient tolerated the procedure well.  Sponge, lap and needle counts correct x 2.  Patient taken to recovery in stable condition.   SPECIMEN:  Source of Specimen:  POC  DISPOSITION OF SPECIMEN:  PATHOLOGY  COUNTS:  YES  PLAN OF CARE: Discharge to home after PACU  PATIENT DISPOSITION:  PACU - hemodynamically stable.   Delay start of Pharmacological VTE agent (>24hrs) due to surgical blood loss or risk of bleeding: not applicable

## 2020-06-19 ENCOUNTER — Encounter (HOSPITAL_BASED_OUTPATIENT_CLINIC_OR_DEPARTMENT_OTHER): Payer: Self-pay | Admitting: Obstetrics and Gynecology

## 2020-06-19 LAB — SURGICAL PATHOLOGY

## 2020-06-21 NOTE — Anesthesia Postprocedure Evaluation (Signed)
Anesthesia Post Note  Patient: Betty Chung  Procedure(s) Performed: DILATATION AND EVACUATION (N/A Vagina ) CHROMOSOME STUDIES (N/A Vagina )     Patient location during evaluation: PACU Anesthesia Type: General Level of consciousness: awake and alert Pain management: pain level controlled Vital Signs Assessment: post-procedure vital signs reviewed and stable Respiratory status: spontaneous breathing, nonlabored ventilation, respiratory function stable and patient connected to nasal cannula oxygen Cardiovascular status: blood pressure returned to baseline and stable Postop Assessment: no apparent nausea or vomiting Anesthetic complications: no   No complications documented.  Last Vitals:  Vitals:   06/18/20 1345 06/18/20 1442  BP: 113/61 109/61  Pulse: 60 73  Resp: 14 15  Temp:  37.3 C  SpO2: 100% 98%    Last Pain:  Vitals:   06/18/20 1442  TempSrc:   PainSc: 6                   

## 2020-07-03 DIAGNOSIS — O039 Complete or unspecified spontaneous abortion without complication: Secondary | ICD-10-CM | POA: Diagnosis not present

## 2020-07-10 DIAGNOSIS — N96 Recurrent pregnancy loss: Secondary | ICD-10-CM | POA: Diagnosis not present

## 2020-07-17 DIAGNOSIS — O039 Complete or unspecified spontaneous abortion without complication: Secondary | ICD-10-CM | POA: Diagnosis not present

## 2020-07-18 ENCOUNTER — Encounter: Payer: Self-pay | Admitting: Gynecologic Oncology

## 2020-07-24 DIAGNOSIS — O039 Complete or unspecified spontaneous abortion without complication: Secondary | ICD-10-CM | POA: Diagnosis not present

## 2020-08-08 DIAGNOSIS — Z20828 Contact with and (suspected) exposure to other viral communicable diseases: Secondary | ICD-10-CM | POA: Diagnosis not present

## 2020-08-21 DIAGNOSIS — O020 Blighted ovum and nonhydatidiform mole: Secondary | ICD-10-CM | POA: Diagnosis not present

## 2020-09-02 ENCOUNTER — Encounter: Payer: Self-pay | Admitting: Gynecologic Oncology

## 2020-09-12 DIAGNOSIS — N96 Recurrent pregnancy loss: Secondary | ICD-10-CM | POA: Diagnosis not present

## 2020-09-25 DIAGNOSIS — U071 COVID-19: Secondary | ICD-10-CM

## 2020-09-25 HISTORY — DX: COVID-19: U07.1

## 2020-10-02 ENCOUNTER — Ambulatory Visit: Payer: Medicaid Other

## 2020-10-10 DIAGNOSIS — Z0000 Encounter for general adult medical examination without abnormal findings: Secondary | ICD-10-CM | POA: Diagnosis not present

## 2020-10-10 DIAGNOSIS — Z124 Encounter for screening for malignant neoplasm of cervix: Secondary | ICD-10-CM | POA: Diagnosis not present

## 2020-10-31 DIAGNOSIS — N96 Recurrent pregnancy loss: Secondary | ICD-10-CM | POA: Diagnosis not present

## 2020-12-06 ENCOUNTER — Ambulatory Visit
Admission: RE | Admit: 2020-12-06 | Discharge: 2020-12-06 | Disposition: A | Discharge status: Home / Self Care | Payer: Medicaid Other | Source: Ambulatory Visit | Attending: Family Medicine | Admitting: Family Medicine

## 2020-12-06 ENCOUNTER — Other Ambulatory Visit: Payer: Self-pay

## 2020-12-06 VITALS — BP 110/69 | HR 98 | Temp 98.8°F | Resp 18

## 2020-12-06 DIAGNOSIS — R0981 Nasal congestion: Secondary | ICD-10-CM | POA: Diagnosis not present

## 2020-12-06 NOTE — ED Provider Notes (Signed)
Renaldo Fiddler    CSN: [id number] Arrival date & time: 12/06/20  0846      History   Chief Complaint Chief Complaint  Patient presents with  . Facial Pain  . Sore Throat    HPI Betty Chung is a 30 y.o. female.   Pt is a 30 year old female that presents with nasal congestion, sore throat. This started yesterday. Positive for Covid 5 weeks ago. Completley recovered from that. Took OTC meds. No fever.      Past Medical History:  Diagnosis Date  . COVID-19 09/2020  . History of molar pregnancy, antepartum 05/2019  . Missed ab   . Wears glasses     Patient Active Problem List   Diagnosis Date Noted  . 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 Miscarriage 05/16/2019    Past Surgical History:  Procedure Laterality Date  . DILATION AND EVACUATION N/A 06/06/2019   Procedure: DILATATION AND EVACUATION;  Surgeon: Philip Aspen, DO;  Location: [facility];  Service: Gynecology;  Laterality: N/A;  . HYSTEROSCOPY WITH D & C N/A 06/18/2020   Procedure: DILATATION AND EVACUATION;  Surgeon: Philip Aspen, DO;  Location: [facility];  Service: Gynecology;  Laterality: N/A;  . KNEE SURGERY Right 2007   mass removed from under patella (per pt benign)  . WISDOM TOOTH EXTRACTION  2011    OB History    Gravida  2   Para  0   Term      Preterm      AB  2   Living        SAB      IAB      Ectopic      Multiple      Live Births           Obstetric Comments  Last pap: 10/26/16 Hx of abnormal pap: yes, in 2017, repeat normal History of STIs: denies Age at menarche: 89         Home Medications    Prior to Admission medications   Medication Sig Start Date End Date Taking? Authorizing Provider  Prenatal Multivit-Min-Fe-FA (PRE-NATAL FORMULA PO) Take by mouth daily.   Yes [provider]  doxycycline (VIBRA-TABS) 100 MG tablet Take tablet approximately 12 hours after  procedure. 06/18/20   Philip Aspen, DO    Family History Family History  Problem Relation Age of Onset  . Uterine cancer Paternal Grandmother     Social History Social History   Tobacco Use  . Smoking status: Never Smoker  . Smokeless tobacco: Never Used  Vaping Use  . Vaping Use: Never used  Substance Use Topics  . Alcohol use: Not Currently  . Drug use: Never     Allergies   Patient has no known allergies.   Review of Systems Review of Systems   Physical Exam Triage Vital Signs ED Triage Vitals  Enc Vitals Group     BP 12/06/20 0852 110/69     Pulse Rate 12/06/20 0852 98     Resp 12/06/20 0852 18     Temp 12/06/20 0852 98.8 F (37.1 C)     Temp Source 12/06/20 0852 Oral     SpO2 12/06/20 0852 98 %     Weight --      Height --      Head Circumference --      Peak Flow --      Pain Score 12/06/20 0854 6  Pain Loc --      Pain Edu? --      Excl. in GC? --    No data found.  Updated Vital Signs BP 110/69 (BP Location: Left Arm)   Pulse 98   Temp 98.8 F (37.1 C) (Oral)   Resp 18   LMP 12/02/2020   SpO2 98%   Visual Acuity Right Eye Distance:   Left Eye Distance:   Bilateral Distance:    Right Eye Near:   Left Eye Near:    Bilateral Near:     Physical Exam Vitals and nursing note reviewed.  Constitutional:      General: She is not in acute distress.    Appearance: Normal appearance. She is not ill-appearing, toxic-appearing or diaphoretic.  HENT:     Head: Normocephalic.     Right Ear: Tympanic membrane and ear canal normal.     Left Ear: Tympanic membrane and ear canal normal.     Nose: Congestion present.     Mouth/Throat:     Pharynx: Oropharynx is clear.  Eyes:     Conjunctiva/sclera: Conjunctivae normal.  Cardiovascular:     Rate and Rhythm: Normal rate and regular rhythm.  Pulmonary:     Effort: Pulmonary effort is normal.     Breath sounds: Normal breath sounds.  Musculoskeletal:        General: Normal range of  motion.     Cervical back: Normal range of motion.  Skin:    General: Skin is warm and dry.     Findings: No rash.  Neurological:     Mental Status: She is alert.  Psychiatric:        Mood and Affect: Mood normal.      UC Treatments / Results  Labs (all labs ordered are listed, but only abnormal results are displayed) Labs Reviewed - No data to display  EKG   Radiology No results found.  Procedures Procedures (including critical care time)  Medications Ordered in UC Medications - No data to display  Initial Impression / Assessment and Plan / UC Course  I have reviewed the triage vital signs and the nursing notes.  Pertinent labs & imaging results that were available during my care of the patient were reviewed by me and considered in my medical decision making (see chart for details).     Sinus congestion  This is most likely viral.  Started yesterday.  Recommend over-the-counter since as needed. Follow up as needed for continued or worsening symptoms  Final Clinical Impressions(s) / UC Diagnoses   Final diagnoses:  Sinus congestion     Discharge Instructions     This is most likely viral  You can take OTC medicines as needed  Follow up as needed for continued or worsening symptoms     ED Prescriptions    None     PDMP not reviewed this encounter.   Janace Aris, NP 12/06/20 [phone]

## 2020-12-06 NOTE — Discharge Instructions (Addendum)
This is most likely viral  You can take OTC medicines as needed  Follow up as needed for continued or worsening symptoms

## 2020-12-06 NOTE — ED Triage Notes (Signed)
Pt presents today with sinus pressure and sore throat that began yesterday. Positive for Covid approx 5 wks ago.

## 2020-12-31 DIAGNOSIS — D6861 Antiphospholipid syndrome: Secondary | ICD-10-CM | POA: Insufficient documentation

## 2021-01-08 DIAGNOSIS — D6861 Antiphospholipid syndrome: Secondary | ICD-10-CM | POA: Diagnosis not present

## 2021-02-10 ENCOUNTER — Ambulatory Visit
Admission: EM | Admit: 2021-02-10 | Discharge: 2021-02-10 | Disposition: A | Discharge status: Home / Self Care | Payer: Medicaid Other

## 2021-02-11 ENCOUNTER — Other Ambulatory Visit: Payer: Self-pay

## 2021-02-11 ENCOUNTER — Ambulatory Visit
Admission: RE | Admit: 2021-02-11 | Discharge: 2021-02-11 | Disposition: A | Discharge status: Home / Self Care | Payer: Medicaid Other | Source: Ambulatory Visit | Attending: Family Medicine | Admitting: Family Medicine

## 2021-02-11 VITALS — BP 138/82 | HR 78 | Temp 98.8°F | Resp 18

## 2021-02-11 DIAGNOSIS — R21 Rash and other nonspecific skin eruption: Secondary | ICD-10-CM | POA: Diagnosis not present

## 2021-02-11 DIAGNOSIS — R2231 Localized swelling, mass and lump, right upper limb: Secondary | ICD-10-CM

## 2021-02-11 MED ORDER — PREDNISONE 20 MG PO TABS: 40.0000 mg | ORAL_TABLET | Freq: Every day | ORAL | 0 refills | Status: AC

## 2021-02-11 MED ORDER — TRIAMCINOLONE ACETONIDE 0.025 % EX OINT: 1.0000 "application " | TOPICAL_OINTMENT | Freq: Three times a day (TID) | CUTANEOUS | 0 refills | Status: DC | PRN

## 2021-02-11 MED ORDER — NAPROXEN 375 MG PO TABS: 375.0000 mg | ORAL_TABLET | Freq: Two times a day (BID) | ORAL | 0 refills | Status: DC

## 2021-02-11 NOTE — ED Provider Notes (Signed)
[facility]    CSN: [id number] Arrival date & time: 02/11/21  0809      History   Chief Complaint Chief Complaint  Patient presents with  . appt 9  . Rash    HPI Betty Chung is a 30 y.o. female.   HPI  Patient present for evaluation of rash right flank. The rash initially appeared as two small bite marks thought to possibly be insect bites. No fever, drainage, nausea or vomiting. Rash is itching and painful  Past Medical History:  Diagnosis Date  . COVID-19 09/2020  . History of molar pregnancy, antepartum 05/2019  . Missed ab   . Wears glasses     Patient Active Problem List   Diagnosis Date Noted  . 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 Miscarriage 05/16/2019    Past Surgical History:  Procedure Laterality Date  . DILATION AND EVACUATION N/A 06/06/2019   Procedure: DILATATION AND EVACUATION;  Surgeon: Philip Aspen, DO;  Location: [facility] [place];  Service: Gynecology;  Laterality: N/A;  . HYSTEROSCOPY WITH D & C N/A 06/18/2020   Procedure: DILATATION AND EVACUATION;  Surgeon: Philip Aspen, DO;  Location: [facility] [place];  Service: Gynecology;  Laterality: N/A;  . KNEE SURGERY Right 2007   mass removed from under patella (per pt benign)  . WISDOM TOOTH EXTRACTION  2011    OB History    Gravida  2   Para  0   Term      Preterm      AB  2   Living        SAB      IAB      Ectopic      Multiple      Live Births           Obstetric Comments  Last pap: 10/26/16 Hx of abnormal pap: yes, in 2017, repeat normal History of STIs: denies Age at menarche: 60         Home Medications    Prior to Admission medications   Medication Sig Start Date End Date Taking? Authorizing Provider  Prenatal Multivit-Min-Fe-FA (PRE-NATAL FORMULA PO) Take by mouth daily.    [provider]    Family History Family History  Problem Relation Age of Onset  .  Uterine cancer Paternal Grandmother     Social History Social History   Tobacco Use  . Smoking status: Never Smoker  . Smokeless tobacco: Never Used  Vaping Use  . Vaping Use: Never used  Substance Use Topics  . Alcohol use: Not Currently  . Drug use: Never     Allergies   Patient has no known allergies.   Review of Systems Review of Systems Pertinent negatives listed in HPI   Physical Exam Triage Vital Signs ED Triage Vitals [02/11/21 0821]  Enc Vitals Group     BP 138/82     Pulse Rate 78     Resp 18     Temp 98.8 F (37.1 C)     Temp Source Oral     SpO2 99 %     Weight      Height      Head Circumference      Peak Flow      Pain Score 4     Pain Loc      Pain Edu?      Excl. in GC?    No data found.  Updated Vital Signs BP  138/82 (BP Location: Left Arm)   Pulse 78   Temp 98.8 F (37.1 C) (Oral)   Resp 18   LMP 01/27/2021   SpO2 99%   Visual Acuity Right Eye Distance:   Left Eye Distance:   Bilateral Distance:    Right Eye Near:   Left Eye Near:    Bilateral Near:     Physical Exam General appearance: alert, well developed, well nourished, cooperative  Head: Normocephalic, without obvious abnormality, atraumatic Respiratory: Respirations even and unlabored, normal respiratory rate Heart: Rate and rhythm normal. No gallop or murmurs noted on exam  Extremities: No gross deformities Skin: Skin color, texture, turgor normal. Erythema indurated macular papular rash with harden center to rash  Psych: Appropriate mood and affect. Neurologic: GCS 15, normal gait, normal coordination   UC Treatments / Results  Labs (all labs ordered are listed, but only abnormal results are displayed) Labs Reviewed - No data to display  EKG   Radiology No results found.  Procedures Procedures (including critical care time)  Medications Ordered in UC Medications - No data to display  Initial Impression / Assessment and Plan / UC Course  I have  reviewed the triage vital signs and the nursing notes.  Pertinent labs & imaging results that were available during my care of the patient were reviewed by me and considered in my medical decision making (see chart for details).     Rash, unspecific, treating with triamcinolone cream and prednisone. Unrelated to current complaint, patient has nodule on right finger, provided information to follow-up with [facility] if nodule remains following completion of naproxen and prednisone. PCP follow-up as needed.  Final Clinical Impressions(s) / UC Diagnoses   Final diagnoses:  Rash and nonspecific skin eruption  Nodule of finger of right hand     Discharge Instructions     Take naproxen 375 twice daily for the next 10 days.  If the nodule on your right finger has not improved follow-up with [facility].  Complete entire course of prednisone for rash on your back.  You may apply triamcinolone cream topically 3 times daily as needed to reduce itching.    ED Prescriptions    Medication Sig Dispense Auth. Provider   triamcinolone (KENALOG) 0.025 % ointment Apply 1 application topically 3 (three) times daily as needed. 80 g Bing Neighbors, FNP   predniSONE (DELTASONE) 20 MG tablet Take 2 tablets (40 mg total) by mouth daily with breakfast for 5 days. 10 tablet Bing Neighbors, FNP   naproxen (NAPROSYN) 375 MG tablet Take 1 tablet (375 mg total) by mouth 2 (two) times daily. 20 tablet Bing Neighbors, FNP     PDMP not reviewed this encounter.   Bing Neighbors, [place] 02/19/21 [phone]

## 2021-02-11 NOTE — ED Triage Notes (Signed)
Pt c/o itchy rash to rt upper side since Friday. States started out like 2 small bites. C/o pain to palm of rt hand under middle finger since Friday. Denies injury, states feels a hard spot like bone that has never been there before.

## 2021-02-11 NOTE — Discharge Instructions (Addendum)
Take naproxen 375 twice daily for the next 10 days.  If the nodule on your right finger has not improved follow-up with [facility].  Complete entire course of prednisone for rash on your back.  You may apply triamcinolone cream topically 3 times daily as needed to reduce itching.

## 2021-02-16 IMAGING — MR MR PELVIS WO/W CM
9 of 14 series · 19 of 48 positions shown · IV contrast (gadavist)
Comparison: None.

CLINICAL DATA: Persistent vaginal bleeding. Status post D&C for
molar pregnancy. Possible uterine AV malformation.

EXAM:
MRI PELVIS WITHOUT AND WITH CONTRAST
TECHNIQUE: Multiplanar multisequence MR imaging of the pelvis was performed
both before and after administration of intravenous contrast.
CONTRAST:  7mL GADAVIST GADOBUTROL 1 MMOL/ML IV SOLN

[Series 3: T2 · coronal · 6.0mm · 0.86mm/px · 1 of 29 slices shown (1 of 4)]
[im 1/29]
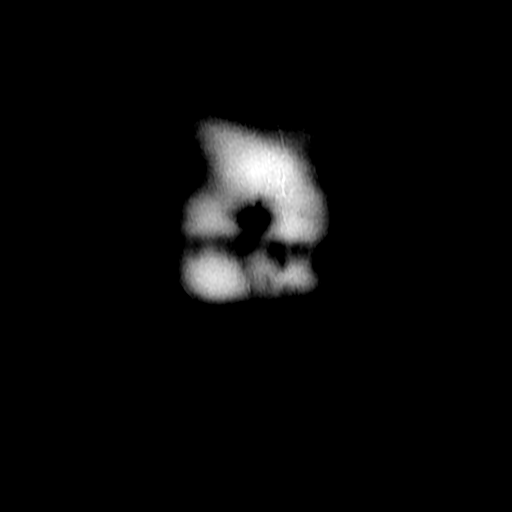

[Series 4: T2 · coronal · 5.0mm · 0.47mm/px · 1 of 32 slices shown (2 of 4)]
[im 1/32]
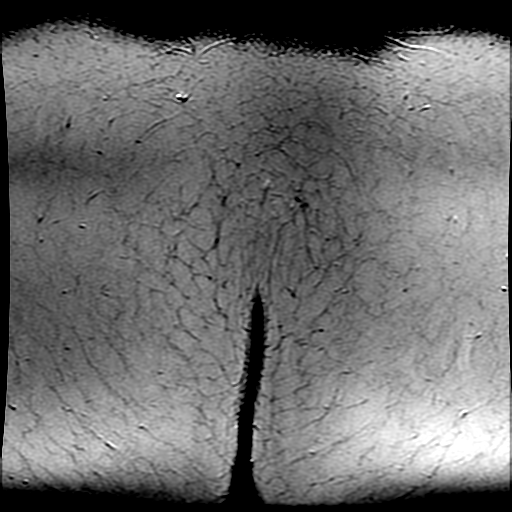

[Series 5: T2 · axial · 5.0mm · 0.47mm/px · z∈[-165,+81]mm · 2 of 42 slices shown (3 of 4)]
[im 1/42]
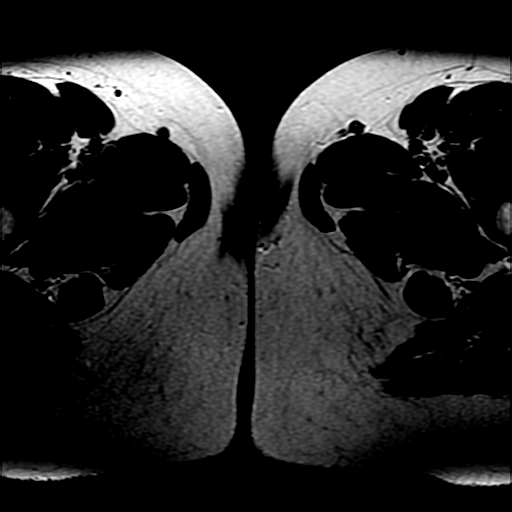
[im 42/42]
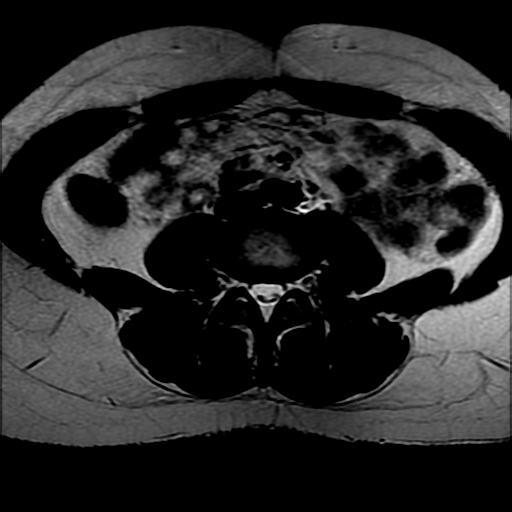

[Series 6: T2 fat-sat · axial · 5.0mm · 0.47mm/px · z∈[-165,+81]mm · 3 of 42 slices shown]
[im 1/42]
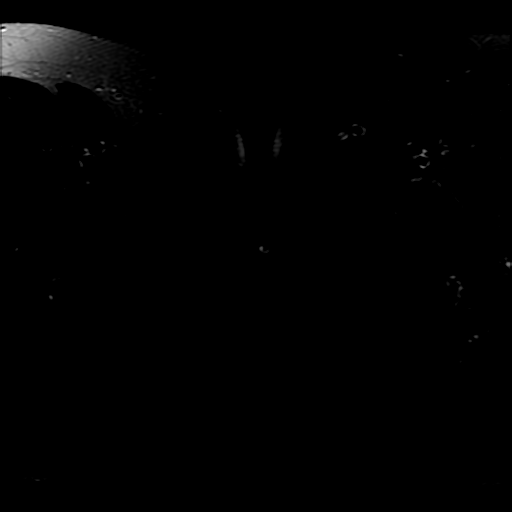
[im 21/42]
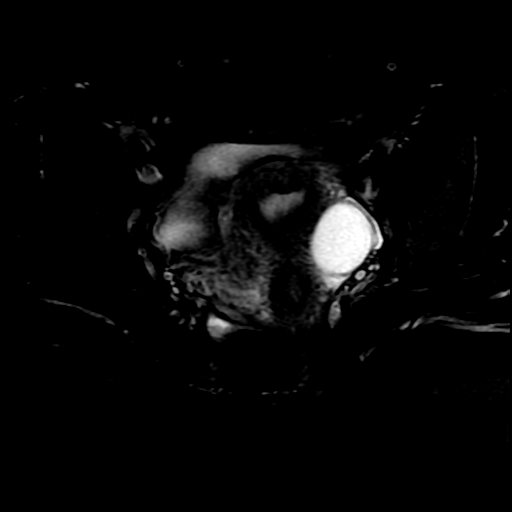
[im 42/42]
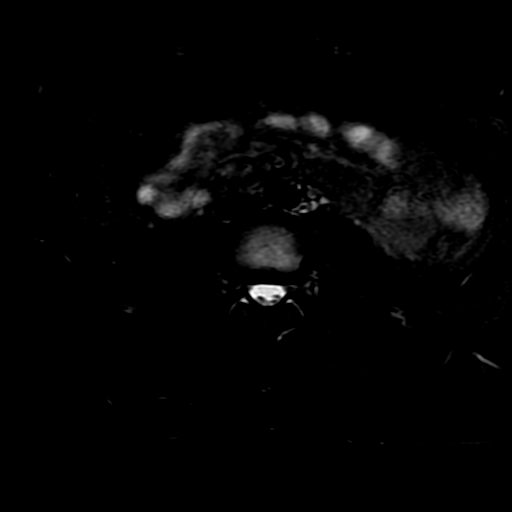

[Series 7: T2 · sagittal · 5.0mm · 0.47mm/px · 2 of 32 slices shown (4 of 4)]
[im 1/32]
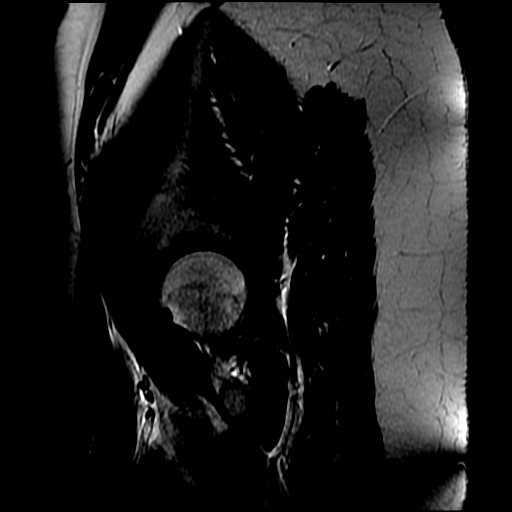
[im 32/32]
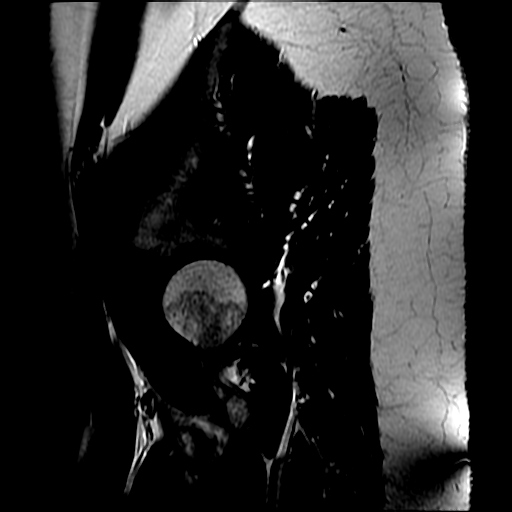

[Series 8: T1 · axial · 5.0mm · 0.47mm/px · z∈[-165,+81]mm · 3 of 42 slices shown]
[im 1/42]
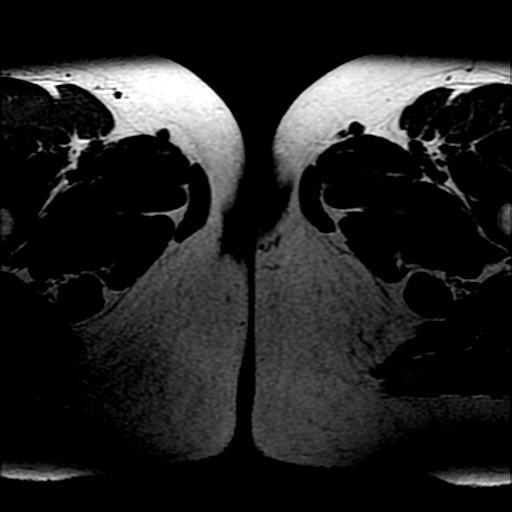
[im 21/42]
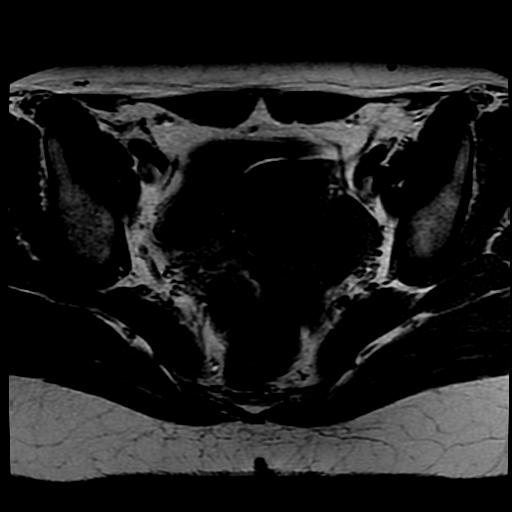
[im 42/42]
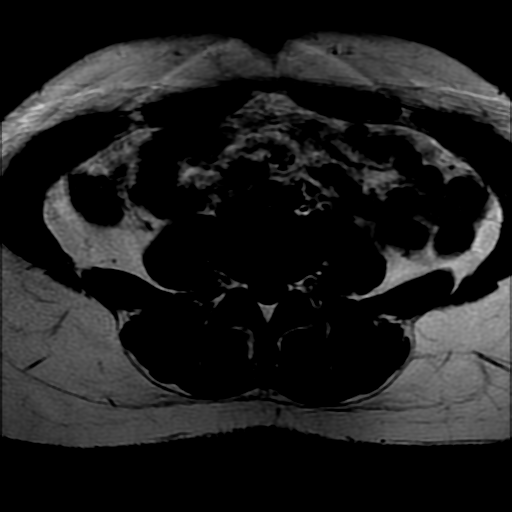

[Series 10: T2 post-contrast · sagittal · 5.0mm · 0.47mm/px · 2 of 32 slices shown]
[im 1/32]
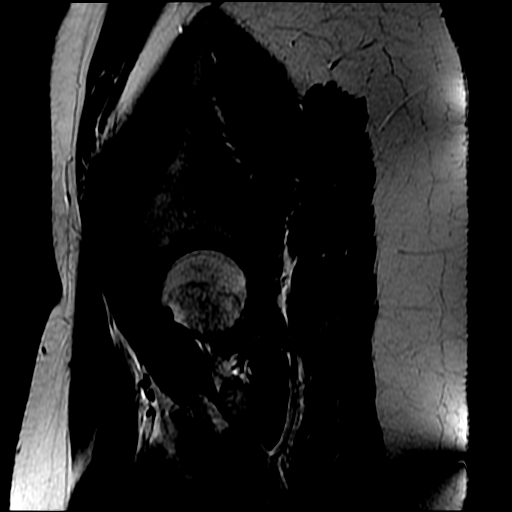
[im 32/32]
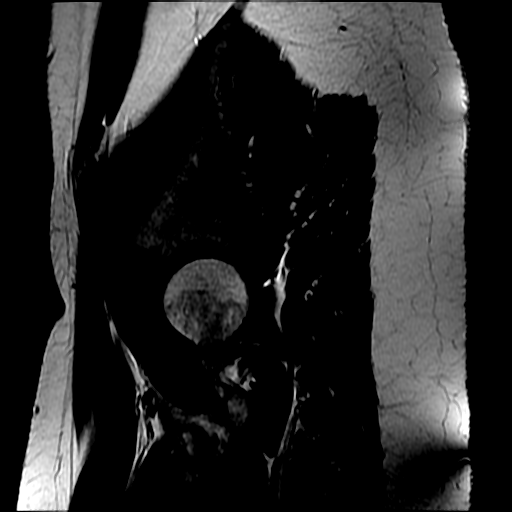

[Series 11: T1 fat-sat post-contrast · axial · 5.0mm · 0.47mm/px · z∈[-165,+81]mm · 3 of 42 slices shown (1 of 2)]
[im 1/42]
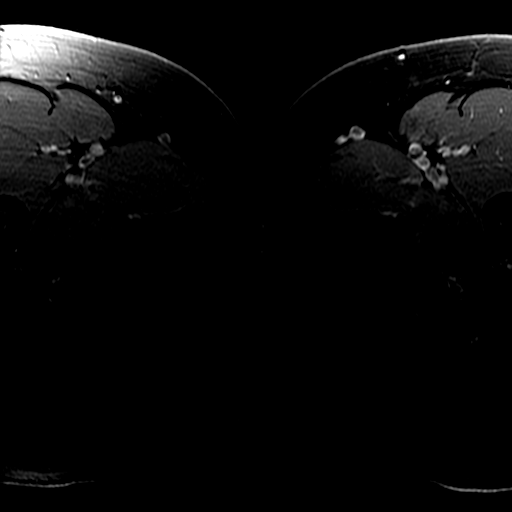
[im 21/42]
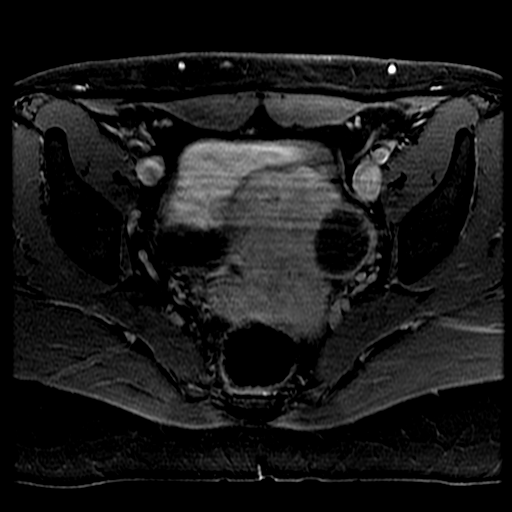
[im 42/42]
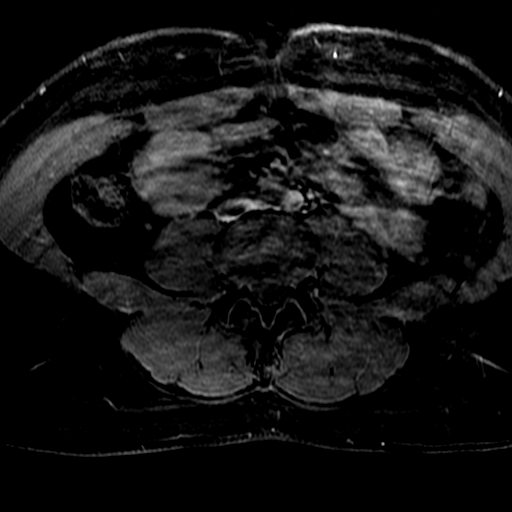

[Series 12: T1 fat-sat post-contrast · coronal · 5.0mm · 0.47mm/px · 2 of 32 slices shown (2 of 2)]
[im 1/32]
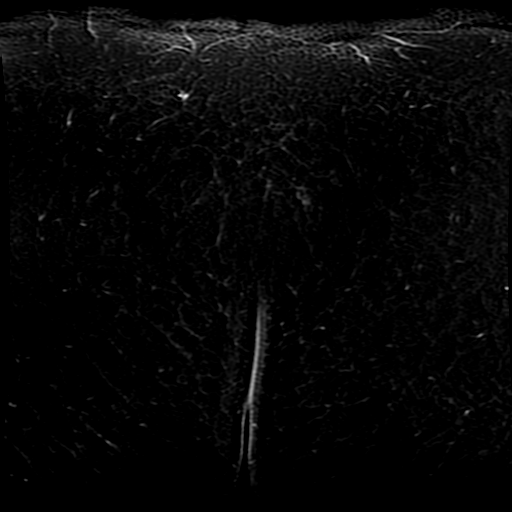
[im 32/32]
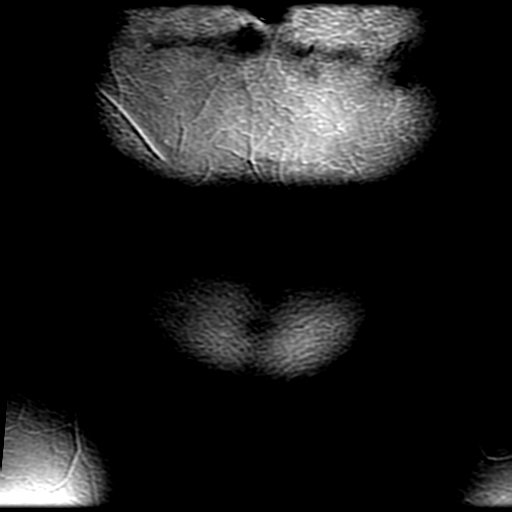

[19 of 48 positions shown; findings below may reference images not displayed]

FINDINGS: Lower Urinary Tract: No urinary bladder or urethral abnormality
identified.

Bowel: Unremarkable appearance of rectum and other pelvic bowel
loops.

Vascular/Lymphatic: Unremarkable. No pathologically enlarged pelvic
lymph nodes identified.

Reproductive:

-- Uterus: Measures 9.0 x 4.0 x 5.4 cm (volume = 100 cm^3). A
cm lesion is seen in the myometrium of the fundal region which shows
cystic foci. This is suspicious for a focal adenomyoma, with tiny
fibroid considered less likely. Cervix and vagina are unremarkable.
No uterine AVM identified.

-- Right ovary: Appears normal. No ovarian or adnexal masses
identified.

-- Left ovary: A 3.8 cm simple cyst is seen with benign features.
Tiny amount of free fluid also seen in pelvic cul-de-sac.

Other: No peritoneal thickening or abnormal free fluid.

Musculoskeletal:  Unremarkable.
IMPRESSION: 1. 3.8 cm benign-appearing left ovarian cyst, most likely
physiologic in a reproductive age female.
2. 1.3 cm lesion in the fundal myometrium, suspicious for focal
adenomyoma, with tiny fibroid considered less likely.
3. No evidence of uterine arteriovenous malformation or other
significant abnormality.

## 2021-02-28 ENCOUNTER — Other Ambulatory Visit: Payer: Self-pay

## 2021-02-28 ENCOUNTER — Ambulatory Visit
Admission: EM | Admit: 2021-02-28 | Discharge: 2021-02-28 | Disposition: A | Discharge status: Home / Self Care | Payer: Medicaid Other | Attending: Emergency Medicine | Admitting: Emergency Medicine

## 2021-02-28 DIAGNOSIS — H6123 Impacted cerumen, bilateral: Secondary | ICD-10-CM | POA: Diagnosis not present

## 2021-02-28 NOTE — ED Provider Notes (Signed)
Betty Chung    CSN: [id number] Arrival date & time: 02/28/21  1541      History   Chief Complaint Chief Complaint  Patient presents with  . Ear Fullness    HPI Betty Chung is a 30 y.o. female.   Patient presents with right ear "clogged" since this morning.  She denies ear pain, ear drainage, fever, chills, cough, shortness of breath, or other symptoms.  Treatment attempted at home by flushing her ear with warm water.  She denies pertinent medical history.  The history is provided by the patient.    Past Medical History:  Diagnosis Date  . COVID-19 09/2020  . History of molar pregnancy, antepartum 05/2019  . Missed ab   . Wears glasses     Patient Active Problem List   Diagnosis Date Noted  . 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 Miscarriage 05/16/2019    Past Surgical History:  Procedure Laterality Date  . DILATION AND EVACUATION N/A 06/06/2019   Procedure: DILATATION AND EVACUATION;  Surgeon: Philip Aspen, DO;  Location: [facility] [place];  Service: Gynecology;  Laterality: N/A;  . HYSTEROSCOPY WITH D & C N/A 06/18/2020   Procedure: DILATATION AND EVACUATION;  Surgeon: Philip Aspen, DO;  Location: [facility] [place];  Service: Gynecology;  Laterality: N/A;  . KNEE SURGERY Right 2007   mass removed from under patella (per pt benign)  . WISDOM TOOTH EXTRACTION  2011    OB History    Gravida  2   Para  0   Term      Preterm      AB  2   Living        SAB      IAB      Ectopic      Multiple      Live Births           Obstetric Comments  Last pap: 10/26/16 Hx of abnormal pap: yes, in 2017, repeat normal History of STIs: denies Age at menarche: 43         Home Medications    Prior to Admission medications   Medication Sig Start Date End Date Taking? Authorizing Provider  naproxen (NAPROSYN) 375 MG tablet Take 1 tablet (375 mg total) by mouth 2 (two) times  daily. 02/11/21   Bing Neighbors, FNP  Prenatal Multivit-Min-Fe-FA (PRE-NATAL FORMULA PO) Take by mouth daily.    [provider]  triamcinolone (KENALOG) 0.025 % ointment Apply 1 application topically 3 (three) times daily as needed. 02/11/21   Bing Neighbors, FNP    Family History Family History  Problem Relation Age of Onset  . Uterine cancer Paternal Grandmother     Social History Social History   Tobacco Use  . Smoking status: Never Smoker  . Smokeless tobacco: Never Used  Vaping Use  . Vaping Use: Never used  Substance Use Topics  . Alcohol use: Yes  . Drug use: Never     Allergies   Patient has no known allergies.   Review of Systems Review of Systems  Constitutional: Negative for chills and fever.  HENT: Negative for ear discharge, ear pain and sore throat.        "clogged" right ear  Respiratory: Negative for cough and shortness of breath.   Cardiovascular: Negative for chest pain and palpitations.  Gastrointestinal: Negative for abdominal pain and vomiting.  Skin: Negative for color change and rash.  All other systems reviewed  and are negative.    Physical Exam Triage Vital Signs ED Triage Vitals  Enc Vitals Group     BP      Pulse      Resp      Temp      Temp src      SpO2      Weight      Height      Head Circumference      Peak Flow      Pain Score      Pain Loc      Pain Edu?      Excl. in GC?    No data found.  Updated Vital Signs BP 118/76   Pulse 73   Temp 98.3 F (36.8 C) (Oral)   Resp 18   Ht 5\' 4"  (1.626 m)   Wt 149 lb (67.6 kg)   LMP 02/23/2021   SpO2 98%   BMI 25.58 kg/m   Visual Acuity Right Eye Distance:   Left Eye Distance:   Bilateral Distance:    Right Eye Near:   Left Eye Near:    Bilateral Near:     Physical Exam Vitals and nursing note reviewed.  Constitutional:      General: She is not in acute distress.    Appearance: She is well-developed. She is not ill-appearing.  HENT:      Head: Normocephalic and atraumatic.     Right Ear: There is impacted cerumen.     Left Ear: There is impacted cerumen.     Ears:     Comments: TMs noted to be clear after removal of cerumen.    Nose: Nose normal.     Mouth/Throat:     Mouth: Mucous membranes are moist.     Pharynx: Oropharynx is clear.  Eyes:     Conjunctiva/sclera: Conjunctivae normal.  Cardiovascular:     Rate and Rhythm: Normal rate and regular rhythm.     Heart sounds: Normal heart sounds.  Pulmonary:     Effort: Pulmonary effort is normal. No respiratory distress.     Breath sounds: Normal breath sounds.  Abdominal:     Palpations: Abdomen is soft.     Tenderness: There is no abdominal tenderness.  Musculoskeletal:     Cervical back: Neck supple.  Skin:    General: Skin is warm and dry.  Neurological:     General: No focal deficit present.     Mental Status: She is alert and oriented to person, place, and time.     Gait: Gait normal.  Psychiatric:        Mood and Affect: Mood normal.        Behavior: Behavior normal.      UC Treatments / Results  Labs (all labs ordered are listed, but only abnormal results are displayed) Labs Reviewed - No data to display  EKG   Radiology No results found.  Procedures Procedures (including critical care time)  Medications Ordered in UC Medications - No data to display  Initial Impression / Assessment and Plan / UC Course  I have reviewed the triage vital signs and the nursing notes.  Pertinent labs & imaging results that were available during my care of the patient were reviewed by me and considered in my medical decision making (see chart for details).   Bilateral impacted cerumen.  Cerumen removed via irrigation.  Patient reports relief of her symptoms.  TMs noted to be clear after removal of cerumen.  Instructed  her to take Tylenol or ibuprofen as needed for discomfort and to follow-up with her PCP as needed.  She agrees to plan of care.   Final  Clinical Impressions(s) / UC Diagnoses   Final diagnoses:  Bilateral impacted cerumen     Discharge Instructions     Follow up with your primary care provider if your symptoms are not improving.        ED Prescriptions    None     PDMP not reviewed this encounter.   Mickie Bail, NP 02/28/21 [phone]

## 2021-02-28 NOTE — Discharge Instructions (Signed)
Follow up with your primary care provider if your symptoms are not improving.     

## 2021-02-28 NOTE — ED Triage Notes (Signed)
Pt sts her R ear has been clogged since this morning.

## 2021-09-08 ENCOUNTER — Ambulatory Visit
Admission: RE | Admit: 2021-09-08 | Discharge: 2021-09-08 | Disposition: A | Discharge status: Home / Self Care | Payer: Medicaid Other | Source: Ambulatory Visit | Attending: Emergency Medicine | Admitting: Emergency Medicine

## 2021-09-08 ENCOUNTER — Other Ambulatory Visit: Payer: Self-pay

## 2021-09-08 VITALS — BP 112/78 | HR 101 | Temp 98.6°F | Resp 18

## 2021-09-08 DIAGNOSIS — B349 Viral infection, unspecified: Secondary | ICD-10-CM

## 2021-09-08 LAB — POCT INFLUENZA A/B
Influenza A, POC: NEGATIVE
Influenza B, POC: NEGATIVE

## 2021-09-08 NOTE — ED Provider Notes (Signed)
[facility]    CSN: [id number] Arrival date & time: 09/08/21  1122   History   Chief Complaint No chief complaint on file.  HPI Betty Chung is a 30 y.o. female. Patient reports a 4-day history of sore throat, fever, congestion and body ache.  Patient says she is feeling a little bit better today.  Patient has a mildly elevated heart rate on arrival today but she is afebrile and otherwise well-appearing.  Patient states she is here to be tested for influenza.  Patient denies nausea, vomiting, diarrhea, headache, loss of taste or smell, difficulty swallowing, difficulty maintaining airway, shortness of breath, sinus pain/pressure.  Patient states that no one else in her household is sick at this time.  The history is provided by the patient.   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   Miscarriage 05/16/2019   Past Surgical History:  Procedure Laterality Date   DILATION AND EVACUATION N/A 06/06/2019   Procedure: DILATATION AND EVACUATION;  Surgeon: Philip Aspen, DO;  Location: [facility];  Service: Gynecology;  Laterality: N/A;   HYSTEROSCOPY WITH D & C N/A 06/18/2020   Procedure: DILATATION AND EVACUATION;  Surgeon: Philip Aspen, DO;  Location: [facility] [place];  Service: Gynecology;  Laterality: N/A;   KNEE SURGERY Right 2007   mass removed from under patella (per pt benign)   WISDOM TOOTH EXTRACTION  2011   OB History     Gravida  2   Para  0   Term      Preterm      AB  2   Living         SAB      IAB      Ectopic      Multiple      Live Births           Obstetric Comments  Last pap: 10/26/16 Hx of abnormal pap: yes, in 2017, repeat normal History of STIs: denies Age at menarche:  47        Home Medications    Prior to Admission medications   Not on File   Family History Family History  Problem Relation Age of Onset   Uterine cancer Paternal Grandmother    Social History Social History   Tobacco Use   Smoking status: Never   Smokeless tobacco: Never  Vaping Use   Vaping Use: Never used  Substance Use Topics   Alcohol use: Yes   Drug use: Never   Allergies   Patient has no known allergies.  Review of Systems Review of Systems Pertinent findings noted in history of present illness.   Physical Exam Triage Vital Signs ED Triage Vitals  Enc Vitals Group     BP 08/22/21 0827 (!) 147/82     Pulse Rate 08/22/21 0827 72     Resp 08/22/21 0827 18     Temp 08/22/21 0827 98.3 F (36.8 C)     Temp Source 08/22/21 0827 Oral     SpO2 08/22/21 0827 98 %     Weight --      Height --      Head Circumference --      Peak Flow --      Pain  Score 08/22/21 0826 5     Pain Loc --      Pain Edu? --      Excl. in GC? --    No data found.  Updated Vital Signs BP 112/78 (BP Location: Right Arm)   Pulse (!) 101   Temp 98.6 F (37 C) (Oral)   Resp 18   LMP 08/24/2021   SpO2 98%   Visual Acuity Right Eye Distance:   Left Eye Distance:   Bilateral Distance:    Right Eye Near:   Left Eye Near:    Bilateral Near:     Physical Exam Vitals and nursing note reviewed.  Constitutional:      General: She is not in acute distress.    Appearance: Normal appearance. She is not ill-appearing.  HENT:     Head: Normocephalic and atraumatic.     Salivary Glands: Right salivary gland is not diffusely enlarged or tender. Left salivary gland is not diffusely enlarged or tender.     Right Ear: Tympanic membrane, ear canal and external ear normal. No drainage. No middle ear effusion. There is no impacted cerumen. Tympanic membrane is not erythematous or bulging.     Left Ear: Tympanic membrane, ear canal and external ear normal. No drainage.  No middle ear  effusion. There is no impacted cerumen. Tympanic membrane is not erythematous or bulging.     Nose: Nose normal. No nasal deformity, septal deviation, mucosal edema, congestion or rhinorrhea.     Right Turbinates: Not enlarged, swollen or pale.     Left Turbinates: Not enlarged, swollen or pale.     Right Sinus: No maxillary sinus tenderness or frontal sinus tenderness.     Left Sinus: No maxillary sinus tenderness or frontal sinus tenderness.     Mouth/Throat:     Lips: Pink. No lesions.     Mouth: Mucous membranes are moist. No oral lesions.     Pharynx: Oropharynx is clear. Uvula midline. No posterior oropharyngeal erythema or uvula swelling.     Tonsils: No tonsillar exudate. 0 on the right. 0 on the left.  Eyes:     General: Lids are normal.        Right eye: No discharge.        Left eye: No discharge.     Extraocular Movements: Extraocular movements intact.     Conjunctiva/sclera: Conjunctivae normal.     Right eye: Right conjunctiva is not injected.     Left eye: Left conjunctiva is not injected.  Neck:     Trachea: Trachea and phonation normal.  Cardiovascular:     Rate and Rhythm: Normal rate and regular rhythm.     Pulses: Normal pulses.     Heart sounds: Normal heart sounds. No murmur heard.   No friction rub. No gallop.  Pulmonary:     Effort: Pulmonary effort is normal. No accessory muscle usage, prolonged expiration or respiratory distress.     Breath sounds: Normal breath sounds. No stridor, decreased air movement or transmitted upper airway sounds. No decreased breath sounds, wheezing, rhonchi or rales.  Chest:     Chest wall: No tenderness.  Musculoskeletal:        General: Normal range of motion.     Cervical back: Normal range of motion and neck supple. Normal range of motion.  Lymphadenopathy:     Cervical: No cervical adenopathy.  Skin:    General: Skin is warm and dry.     Findings: No erythema or rash.  Neurological:     General: No focal deficit  present.     Mental Status: She is alert and oriented to person, place, and time.  Psychiatric:        Mood and Affect: Mood normal.        Behavior: Behavior normal.   UC Treatments / Results  Labs (all labs ordered are listed, but only abnormal results are displayed)  Labs Reviewed  POCT INFLUENZA A/B    EKG  Radiology No results found.  Procedures Procedures (including critical care time)  Medications Ordered in UC Medications - No data to display  Initial Impression / Assessment and Plan / UC Course  I have reviewed the triage vital signs and the nursing notes.  Pertinent labs & imaging results that were available during my care of the patient were reviewed by me and considered in my medical decision making (see chart for details).      Physical exam today is unremarkable.  Patient's rapid flu test today is negative.  Conservative care recommended at this time.  Return precautions advised.  Final Clinical Impressions(s) / UC Diagnoses   Final diagnoses:  Viral illness     Discharge Instructions      Your influenza test today is negative.  This means that you could have one of the many upper respiratory viruses that are circulating in our community at this time.  The nice thing about viruses is that they are usually self-limiting and resolve within 5 to 7 days.  Conservative care is recommended at this time.  This includes rest, pushing clear fluids and activity as tolerated.  You may also noticed that your child's appetite is reduced, this is okay as long as they are drinking plenty of clear fluids.  Acetaminophen (Tylenol): This is a good fever reducer.  If there body temperature rises above 101.5 as measured with a thermometer, it is recommended that you give them 1,000 mg every 6-8 hours until they are temperature falls below 101.5, please not take more than 3,000 mg of acetaminophen either as a separate medication or as in ingredient in an over-the-counter  cold/flu preparation within a 24-hour period  Ibuprofen  (Advil, Motrin): This is a good anti-inflammatory medication which addresses aches and pains and, to some degree, congestion in the nasal passages.  I recommend giving between 400 to 600 mg every 6-8 hours as needed.  Pseudoephedrine (Sudafed): This is a decongestant.  This medication has to be purchased from the pharmacist counter, I recommend giving 2 tablets, 60 mg, 2-3 times a day as needed to relieve runny nose and sinus drainage.  Guaifenesin (Robitussin, Mucinex): This is an expectorant.  This helps break up chest congestion and loosen up thick nasal drainage making phlegm and drainage more liquid and therefore easier to remove.  I recommend being 400 mg three times daily as needed.  Dextromethorphan (any cough medicine with the letters "DM" added to it's name such as Robitussin DM): This is a cough suppressant.  This is often recommended to be taken at nighttime to suppress cough and help children sleep.  Give dosage as directed on the bottle.   Chloraseptic Throat Spray: Spray 5 sprays into affected area every 2 hours, hold for 15 seconds and either swallow or spit it out.  This is a excellent numbing medication because it is a spray, you can put it right where you needed and so sucking on a lozenge and numbing your entire mouth.  Based on my physical exam findings  and the history provided  today, I do not see any evidence of bacterial infection therefore treatment with antibiotics would be of no benefit.  Please follow-up within the next 3 to 5 days either with your primary care provider or urgent care if their symptoms do not resolve.  If you do not have a primary care provider, we will assist you in finding one.      ED Prescriptions   None    PDMP not reviewed this encounter.  Disposition Upon Discharge:  Patient presented with an acute illness with associated systemic symptoms and significant discomfort requiring urgent  management. In my opinion, this is a condition that a prudent lay person (someone who possesses an average knowledge of health and medicine) may potentially expect to result in complications if not addressed urgently such as respiratory distress, impairment of bodily function or dysfunction of bodily organs.   Routine symptom specific, illness specific and/or disease specific instructions were discussed with the patient and/or caregiver at length.   As such, the patient has been evaluated and assessed, work-up was performed and treatment was provided in alignment with urgent care protocols and evidence based medicine.  Patient/parent/caregiver has been advised that the patient may require follow up for further testing and treatment if the symptoms continue in spite of treatment, as clinically indicated and appropriate.  Patient/parent/caregiver has been advised to return to the [facility] or PCP in 3-5 days if no better; to PCP or the Emergency Department if new signs and symptoms develop, or if the current signs or symptoms continue to change or worsen for further workup, evaluation and treatment as clinically indicated and appropriate  The patient will follow up with their current PCP if and as advised. If the patient does not currently have a PCP we will assist them in obtaining one.   Patient/parent/caregiver verbalized understanding and agreement of plan as discussed.  All questions were addressed during visit.  Please see discharge instructions below for further details of plan.  Condition: stable for discharge home Home: take medications as prescribed; routine discharge instructions as discussed; follow up as advised.    Theadora Rama Scales, [place] 09/09/21 [phone]

## 2021-09-08 NOTE — ED Triage Notes (Signed)
Pt reports having sore throat, fever, congestion and body aches. Started: last Thursday

## 2021-09-08 NOTE — Discharge Instructions (Signed)
Your influenza test today is negative.  This means that you could have one of the many upper respiratory viruses that are circulating in our community at this time.  The nice thing about viruses is that they are usually self-limiting and resolve within 5 to 7 days.  Conservative care is recommended at this time.  This includes rest, pushing clear fluids and activity as tolerated.  You may also noticed that your child's appetite is reduced, this is okay as long as they are drinking plenty of clear fluids.  Acetaminophen (Tylenol): This is a good fever reducer.  If there body temperature rises above 101.5 as measured with a thermometer, it is recommended that you give them 1,000 mg every 6-8 hours until they are temperature falls below 101.5, please not take more than 3,000 mg of acetaminophen either as a separate medication or as in ingredient in an over-the-counter cold/flu preparation within a 24-hour period  Ibuprofen  (Advil, Motrin): This is a good anti-inflammatory medication which addresses aches and pains and, to some degree, congestion in the nasal passages.  I recommend giving between 400 to 600 mg every 6-8 hours as needed.  Pseudoephedrine (Sudafed): This is a decongestant.  This medication has to be purchased from the pharmacist counter, I recommend giving 2 tablets, 60 mg, 2-3 times a day as needed to relieve runny nose and sinus drainage.  Guaifenesin (Robitussin, Mucinex): This is an expectorant.  This helps break up chest congestion and loosen up thick nasal drainage making phlegm and drainage more liquid and therefore easier to remove.  I recommend being 400 mg three times daily as needed.  Dextromethorphan (any cough medicine with the letters "DM" added to it's name such as Robitussin DM): This is a cough suppressant.  This is often recommended to be taken at nighttime to suppress cough and help children sleep.  Give dosage as directed on the bottle.   Chloraseptic Throat Spray: Spray  5 sprays into affected area every 2 hours, hold for 15 seconds and either swallow or spit it out.  This is a excellent numbing medication because it is a spray, you can put it right where you needed and so sucking on a lozenge and numbing your entire mouth.  Based on my physical exam findings and the history provided  today, I do not see any evidence of bacterial infection therefore treatment with antibiotics would be of no benefit.  Please follow-up within the next 3 to 5 days either with your primary care provider or urgent care if their symptoms do not resolve.  If you do not have a primary care provider, we will assist you in finding one.

## 2021-09-29 DIAGNOSIS — Z3162 Encounter for fertility preservation counseling: Secondary | ICD-10-CM | POA: Diagnosis not present

## 2021-10-13 DIAGNOSIS — Z8742 Personal history of other diseases of the female genital tract: Secondary | ICD-10-CM | POA: Diagnosis not present

## 2021-10-13 DIAGNOSIS — Z124 Encounter for screening for malignant neoplasm of cervix: Secondary | ICD-10-CM | POA: Diagnosis not present

## 2021-10-13 DIAGNOSIS — Z0000 Encounter for general adult medical examination without abnormal findings: Secondary | ICD-10-CM | POA: Diagnosis not present

## 2021-10-13 DIAGNOSIS — Z09 Encounter for follow-up examination after completed treatment for conditions other than malignant neoplasm: Secondary | ICD-10-CM | POA: Diagnosis not present

## 2021-10-14 ENCOUNTER — Other Ambulatory Visit: Payer: Self-pay

## 2021-10-14 ENCOUNTER — Ambulatory Visit
Admission: RE | Admit: 2021-10-14 | Discharge: 2021-10-14 | Disposition: A | Discharge status: Home / Self Care | Payer: Medicaid Other | Source: Ambulatory Visit | Attending: Family Medicine | Admitting: Family Medicine

## 2021-10-14 VITALS — BP 108/72 | HR 82 | Temp 99.2°F | Resp 18

## 2021-10-14 DIAGNOSIS — J0140 Acute pansinusitis, unspecified: Secondary | ICD-10-CM | POA: Diagnosis not present

## 2021-10-14 MED ORDER — PREDNISONE 10 MG PO TABS: 10.0000 mg | ORAL_TABLET | Freq: Every day | ORAL | 0 refills | Status: AC

## 2021-10-14 MED ORDER — AZITHROMYCIN 250 MG PO TABS: ORAL_TABLET | ORAL | 0 refills | Status: DC

## 2021-10-14 NOTE — ED Provider Notes (Signed)
[facility]    CSN: [id number] Arrival date & time: 10/14/21  5366      History   Chief Complaint Chief Complaint  Patient presents with   Cough   Nasal Congestion    HPI Betty Chung is a 30 y.o. female.   HPI Patient seen at [facility] approximately 4 weeks ago with a similar course of symptoms.  She was diagnosed with a viral illness and tested negative for influenza.  She reports since that time her symptoms have not improved and have continued to linger and at times have appeared to worsen.  She denies any shortness of breath, chest pain or chest tightness. She has taken multiple over the counter medications without relief. 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   Miscarriage 05/16/2019    Past Surgical History:  Procedure Laterality Date   DILATION AND EVACUATION N/A 06/06/2019   Procedure: DILATATION AND EVACUATION;  Surgeon: Philip Aspen, DO;  Location: [facility] [place];  Service: Gynecology;  Laterality: N/A;   HYSTEROSCOPY WITH D & C N/A 06/18/2020   Procedure: DILATATION AND EVACUATION;  Surgeon: Philip Aspen, DO;  Location: [facility];  Service: Gynecology;  Laterality: N/A;   KNEE SURGERY Right 2007   mass removed from under patella (per pt benign)   WISDOM TOOTH EXTRACTION  2011    OB History     Gravida  2   Para  0   Term      Preterm      AB  2   Living         SAB      IAB      Ectopic      Multiple      Live Births           Obstetric Comments  Last pap: 10/26/16 Hx of abnormal pap: yes, in 2017, repeat normal History of STIs: denies Age at menarche: 81          Home Medications    Prior to Admission medications   Medication Sig Start Date  End Date Taking? Authorizing Provider  azithromycin (ZITHROMAX) 250 MG tablet Take 2 tabs PO x 1 dose, then 1 tab PO QD x 4 days 10/14/21  Yes Bing Neighbors, FNP  predniSONE (DELTASONE) 10 MG tablet Take 1 tablet (10 mg total) by mouth daily with breakfast for 5 days. 10/14/21 10/19/21 Yes Bing Neighbors, FNP    Family History Family History  Problem Relation Age of Onset   Uterine cancer Paternal Grandmother     Social History Social History   Tobacco Use   Smoking status: Never   Smokeless tobacco: Never  Vaping Use   Vaping Use: Never used  Substance Use Topics   Alcohol use: Yes   Drug use: Never     Allergies   Patient has no known allergies.   Review of Systems Review of Systems Pertinent negatives listed in HPI   Physical Exam Triage Vital Signs ED Triage Vitals  Enc Vitals Group     BP 10/14/21 0910 108/72     Pulse Rate 10/14/21 0910 82     Resp 10/14/21 0910 18     Temp 10/14/21 0910 99.2  F (37.3 C)     Temp src --      SpO2 10/14/21 0910 98 %     Weight --      Height --      Head Circumference --      Peak Flow --      Pain Score 10/14/21 0908 0     Pain Loc --      Pain Edu? --      Excl. in GC? --    No data found.  Updated Vital Signs BP 108/72 (BP Location: Left Arm)    Pulse 82    Temp 99.2 F (37.3 C)    Resp 18    LMP  (LMP Unknown)    SpO2 98%   Visual Acuity Right Eye Distance:   Left Eye Distance:   Bilateral Distance:    Right Eye Near:   Left Eye Near:    Bilateral Near:     Physical Exam  General Appearance:    Alert, cooperative, no distress  HENT:   Normocephalic, ears normal, nares mucosal edema with congestion, rhinorrhea, oropharynx  patent   Eyes:    PERRL, conjunctiva/corneas clear, EOM's intact       Lungs:     Clear to auscultation bilaterally, respirations unlabored  Heart:    Regular rate and rhythm  Neurologic:   Awake, alert, oriented x 3. No apparent focal neurological           defect.       UC Treatments / Results  Labs (all labs ordered are listed, but only abnormal results are displayed) Labs Reviewed - No data to display  EKG   Radiology No results found.  Procedures Procedures (including critical care time)  Medications Ordered in UC Medications - No data to display  Initial Impression / Assessment and Plan / UC Course  I have reviewed the triage vital signs and the nursing notes.  Pertinent labs & imaging results that were available during my care of the patient were reviewed by me and considered in my medical decision making (see chart for details).    Acute Sinusitis  Treatment with Azithromycin Prednisone for post nasal induced cough. Recommended starting an antihistamine. Return as needed.  Final Clinical Impressions(s) / UC Diagnoses   Final diagnoses:  Acute non-recurrent pansinusitis     Discharge Instructions      Take medication as prescribed.  Once done with prednisone transition over to taking daily levocetirizine which is an antihistamine which will help manage your nasal symptoms.  Drink plenty of fluids.     ED Prescriptions     Medication Sig Dispense Auth. Provider   azithromycin (ZITHROMAX) 250 MG tablet Take 2 tabs PO x 1 dose, then 1 tab PO QD x 4 days 6 tablet Bing Neighbors, FNP   predniSONE (DELTASONE) 10 MG tablet Take 1 tablet (10 mg total) by mouth daily with breakfast for 5 days. 5 tablet Bing Neighbors, FNP      PDMP not reviewed this encounter.   Bing Neighbors, FNP 10/16/21 [phone]

## 2021-10-14 NOTE — Discharge Instructions (Addendum)
Take medication as prescribed.  Once done with prednisone transition over to taking daily levocetirizine which is an antihistamine which will help manage your nasal symptoms.  Drink plenty of fluids.

## 2021-10-14 NOTE — ED Triage Notes (Signed)
Pt c/o cough and congestion x 1 month  

## 2021-10-15 LAB — HM PAP SMEAR
HM Pap smear: NEGATIVE
HPV, high-risk: NEGATIVE

## 2022-04-06 DIAGNOSIS — E039 Hypothyroidism, unspecified: Secondary | ICD-10-CM | POA: Insufficient documentation

## 2022-04-06 LAB — OB RESULTS CONSOLE GC/CHLAMYDIA
Chlamydia: NEGATIVE
Neisseria Gonorrhea: NEGATIVE

## 2022-04-20 LAB — HEPATITIS C ANTIBODY: HCV Ab: NEGATIVE

## 2022-04-20 LAB — OB RESULTS CONSOLE RUBELLA ANTIBODY, IGM: Rubella: IMMUNE

## 2022-04-20 LAB — OB RESULTS CONSOLE HIV ANTIBODY (ROUTINE TESTING): HIV: NONREACTIVE

## 2022-04-20 LAB — OB RESULTS CONSOLE RPR: RPR: NONREACTIVE

## 2022-04-20 LAB — OB RESULTS CONSOLE HEPATITIS B SURFACE ANTIGEN: Hepatitis B Surface Ag: NEGATIVE

## 2022-04-20 LAB — OB RESULTS CONSOLE ANTIBODY SCREEN: Antibody Screen: NEGATIVE

## 2022-10-08 LAB — OB RESULTS CONSOLE GBS: GBS: POSITIVE

## 2022-10-20 ENCOUNTER — Encounter (HOSPITAL_COMMUNITY): Payer: Self-pay | Admitting: *Deleted

## 2022-10-20 ENCOUNTER — Telehealth (HOSPITAL_COMMUNITY): Payer: Self-pay | Admitting: *Deleted

## 2022-10-20 NOTE — Telephone Encounter (Signed)
Preadmission screen  

## 2022-10-26 NOTE — L&D Delivery Note (Signed)
Delivery Note  Patient pushed well making steady progress for about 1 1/2 hour.  At 9:07 AM a healthy female was delivered via Vaginal, Spontaneous (Presentation:   Occiput Anterior).  APGAR: 8, 9; weight  pending.   Placenta status:  delivered spontaneously.  Cord: 3 vessels with the following complications: None.  Cord pH: none.  After the delivery of the placenta the patient had uterine atony with some brisk bleeding.  She received TXA, pitocin, methergine x 1 and rectal cytotec 800mg  with improved tone. Bladder was also emptied.  Corey Skains was brought into room and available but bleeding improved so held off for now.  Will continue epidural for one hour and observe bleeding before d/c.  Starting Hgb 11.2  Anesthesia: Epidural Episiotomy: None Lacerations:  Second degree Suture Repair: 3.0 vicryl rapide Est. Blood Loss (mL):  1013mL  Mom to postpartum.  Baby to Couplet care / Skin to Skin.  D/w pt and husband circumcision and they desire.  Logan Bores 10/29/2022, 9:46 AM

## 2022-10-28 ENCOUNTER — Inpatient Hospital Stay (HOSPITAL_COMMUNITY)
Admission: RE | Admit: 2022-10-28 | Discharge: 2022-10-30 | DRG: 806 | Disposition: A | Discharge status: Home / Self Care | Payer: BC Managed Care – PPO | Attending: Obstetrics and Gynecology | Admitting: Obstetrics and Gynecology

## 2022-10-28 ENCOUNTER — Other Ambulatory Visit: Payer: Self-pay

## 2022-10-28 ENCOUNTER — Inpatient Hospital Stay (HOSPITAL_COMMUNITY): Payer: BC Managed Care – PPO | Admitting: Anesthesiology

## 2022-10-28 ENCOUNTER — Encounter (HOSPITAL_COMMUNITY): Payer: Self-pay | Admitting: Obstetrics and Gynecology

## 2022-10-28 ENCOUNTER — Inpatient Hospital Stay (HOSPITAL_COMMUNITY): Payer: BC Managed Care – PPO

## 2022-10-28 DIAGNOSIS — O99824 Streptococcus B carrier state complicating childbirth: Secondary | ICD-10-CM | POA: Diagnosis present

## 2022-10-28 DIAGNOSIS — D6861 Antiphospholipid syndrome: Secondary | ICD-10-CM | POA: Diagnosis present

## 2022-10-28 DIAGNOSIS — Z3A39 39 weeks gestation of pregnancy: Secondary | ICD-10-CM

## 2022-10-28 DIAGNOSIS — Z3490 Encounter for supervision of normal pregnancy, unspecified, unspecified trimester: Principal | ICD-10-CM

## 2022-10-28 DIAGNOSIS — O9912 Other diseases of the blood and blood-forming organs and certain disorders involving the immune mechanism complicating childbirth: Secondary | ICD-10-CM | POA: Diagnosis present

## 2022-10-28 DIAGNOSIS — O9902 Anemia complicating childbirth: Secondary | ICD-10-CM | POA: Diagnosis present

## 2022-10-28 DIAGNOSIS — Z8616 Personal history of COVID-19: Secondary | ICD-10-CM

## 2022-10-28 DIAGNOSIS — D62 Acute posthemorrhagic anemia: Secondary | ICD-10-CM | POA: Diagnosis not present

## 2022-10-28 LAB — CBC
HCT: 34.8 % — ABNORMAL LOW (ref 36.0–46.0)
Hemoglobin: 11.2 g/dL — ABNORMAL LOW (ref 12.0–15.0)
MCH: 27.5 pg (ref 26.0–34.0)
MCHC: 32.2 g/dL (ref 30.0–36.0)
MCV: 85.5 fL (ref 80.0–100.0)
Platelets: 218 10*3/uL (ref 150–400)
RBC: 4.07 MIL/uL (ref 3.87–5.11)
RDW: 14.2 % (ref 11.5–15.5)
WBC: 9.1 10*3/uL (ref 4.0–10.5)
nRBC: 0 % (ref 0.0–0.2)

## 2022-10-28 LAB — RPR: RPR Ser Ql: NONREACTIVE

## 2022-10-28 LAB — TYPE AND SCREEN
ABO/RH(D): A POS
Antibody Screen: NEGATIVE

## 2022-10-28 MED ORDER — SOD CITRATE-CITRIC ACID 500-334 MG/5ML PO SOLN
30.0000 mL | ORAL | Status: DC | PRN
  Administered 2022-10-28: 30 mL via ORAL
  Filled 2022-10-28: qty 30

## 2022-10-28 MED ORDER — OXYTOCIN-SODIUM CHLORIDE 30-0.9 UT/500ML-% IV SOLN
1.0000 m[IU]/min | INTRAVENOUS | Status: DC
  Administered 2022-10-28: 1 m[IU]/min via INTRAVENOUS
  Filled 2022-10-28: qty 500

## 2022-10-28 MED ORDER — TERBUTALINE SULFATE 1 MG/ML IJ SOLN: 0.2500 mg | Freq: Once | INTRAMUSCULAR | Status: DC | PRN

## 2022-10-28 MED ORDER — OXYTOCIN-SODIUM CHLORIDE 30-0.9 UT/500ML-% IV SOLN
2.5000 [IU]/h | INTRAVENOUS | Status: DC
  Filled 2022-10-28: qty 500

## 2022-10-28 MED ORDER — ACETAMINOPHEN 325 MG PO TABS: 650.0000 mg | ORAL_TABLET | ORAL | Status: DC | PRN

## 2022-10-28 MED ORDER — OXYTOCIN BOLUS FROM INFUSION
333.0000 mL | Freq: Once | INTRAVENOUS | Status: AC
  Administered 2022-10-29: 333 mL via INTRAVENOUS

## 2022-10-28 MED ORDER — MISOPROSTOL 25 MCG QUARTER TABLET
25.0000 ug | ORAL_TABLET | ORAL | Status: DC | PRN
  Administered 2022-10-28 (×2): 25 ug via VAGINAL
  Administered 2022-10-29: 800 ug via VAGINAL
  Filled 2022-10-28 (×2): qty 1

## 2022-10-28 MED ORDER — EPHEDRINE 5 MG/ML INJ: 10.0000 mg | INTRAVENOUS | Status: DC | PRN

## 2022-10-28 MED ORDER — DIPHENHYDRAMINE HCL 50 MG/ML IJ SOLN: 12.5000 mg | INTRAMUSCULAR | Status: DC | PRN

## 2022-10-28 MED ORDER — FAMOTIDINE IN NACL 20-0.9 MG/50ML-% IV SOLN
20.0000 mg | Freq: Once | INTRAVENOUS | Status: AC
  Administered 2022-10-28: 20 mg via INTRAVENOUS
  Filled 2022-10-28: qty 50

## 2022-10-28 MED ORDER — OXYCODONE-ACETAMINOPHEN 5-325 MG PO TABS: 2.0000 | ORAL_TABLET | ORAL | Status: DC | PRN

## 2022-10-28 MED ORDER — LACTATED RINGERS IV SOLN: INTRAVENOUS | Status: DC

## 2022-10-28 MED ORDER — OXYCODONE-ACETAMINOPHEN 5-325 MG PO TABS: 1.0000 | ORAL_TABLET | ORAL | Status: DC | PRN

## 2022-10-28 MED ORDER — ONDANSETRON HCL 4 MG/2ML IJ SOLN
4.0000 mg | Freq: Four times a day (QID) | INTRAMUSCULAR | Status: DC | PRN
  Administered 2022-10-28 – 2022-10-29 (×2): 4 mg via INTRAVENOUS
  Filled 2022-10-28 (×2): qty 2

## 2022-10-28 MED ORDER — LACTATED RINGERS IV SOLN: 500.0000 mL | INTRAVENOUS | Status: DC | PRN

## 2022-10-28 MED ORDER — FENTANYL CITRATE (PF) 100 MCG/2ML IJ SOLN
INTRAMUSCULAR | Status: DC | PRN
  Administered 2022-10-28: 100 ug via EPIDURAL

## 2022-10-28 MED ORDER — PENICILLIN G POT IN DEXTROSE 60000 UNIT/ML IV SOLN
3.0000 10*6.[IU] | INTRAVENOUS | Status: DC
  Administered 2022-10-28 – 2022-10-29 (×5): 3 10*6.[IU] via INTRAVENOUS
  Filled 2022-10-28 (×6): qty 50

## 2022-10-28 MED ORDER — LACTATED RINGERS IV SOLN
500.0000 mL | Freq: Once | INTRAVENOUS | Status: AC
  Administered 2022-10-28: 500 mL via INTRAVENOUS

## 2022-10-28 MED ORDER — LIDOCAINE HCL (PF) 1 % IJ SOLN: 30.0000 mL | INTRAMUSCULAR | Status: DC | PRN

## 2022-10-28 MED ORDER — FENTANYL CITRATE (PF) 100 MCG/2ML IJ SOLN
100.0000 ug | INTRAMUSCULAR | Status: DC | PRN
  Administered 2022-10-28 (×4): 100 ug via INTRAVENOUS
  Filled 2022-10-28 (×5): qty 2

## 2022-10-28 MED ORDER — PHENYLEPHRINE 80 MCG/ML (10ML) SYRINGE FOR IV PUSH (FOR BLOOD PRESSURE SUPPORT): 80.0000 ug | PREFILLED_SYRINGE | INTRAVENOUS | Status: DC | PRN

## 2022-10-28 MED ORDER — SODIUM CHLORIDE 0.9 % IV SOLN
5.0000 10*6.[IU] | Freq: Once | INTRAVENOUS | Status: AC
  Administered 2022-10-28: 5 10*6.[IU] via INTRAVENOUS
  Filled 2022-10-28: qty 5

## 2022-10-28 MED ORDER — BUPIVACAINE HCL (PF) 0.25 % IJ SOLN
INTRAMUSCULAR | Status: DC | PRN
  Administered 2022-10-28 (×2): 3 mL via EPIDURAL
  Administered 2022-10-28: 8 mL via EPIDURAL

## 2022-10-28 MED ORDER — LIDOCAINE-EPINEPHRINE (PF) 2 %-1:200000 IJ SOLN
INTRAMUSCULAR | Status: DC | PRN
  Administered 2022-10-28: 8 mL via EPIDURAL
  Administered 2022-10-28: 3 mL via EPIDURAL

## 2022-10-28 MED ORDER — FENTANYL-BUPIVACAINE-NACL 0.5-0.125-0.9 MG/250ML-% EP SOLN
12.0000 mL/h | EPIDURAL | Status: DC | PRN
  Administered 2022-10-28: 12 mL/h via EPIDURAL
  Filled 2022-10-28 (×2): qty 250

## 2022-10-28 NOTE — Plan of Care (Signed)

## 2022-10-28 NOTE — Anesthesia Procedure Notes (Signed)
Epidural Patient location during procedure: OB Start time: 10/28/2022 4:49 PM End time: 10/28/2022 5:05 PM  Staffing Anesthesiologist: Oleta Mouse, MD Performed: anesthesiologist   Preanesthetic Checklist Completed: patient identified, IV checked, risks and benefits discussed, monitors and equipment checked, pre-op evaluation and timeout performed  Epidural Patient position: sitting Prep: DuraPrep Patient monitoring: heart rate, continuous pulse ox and blood pressure Approach: midline Location: L4-L5 Injection technique: LOR saline  Needle:  Needle type: Tuohy  Needle gauge: 17 G Needle length: 9 cm Needle insertion depth: 7 cm Catheter type: closed end flexible Catheter size: 19 Gauge Catheter at skin depth: 15 cm Test dose: negative and 2% lidocaine with Epi 1:200 K  Assessment Events: blood not aspirated, no cerebrospinal fluid, injection not painful, no injection resistance, no paresthesia and negative IV test  Additional Notes Reason for block:procedure for pain

## 2022-10-28 NOTE — H&P (Addendum)
Betty Chung is a 32 y.o. female presenting for IOL for antiphospholipid antibody syndrome  32 year old G4 P0-0-3-0 at 39+0 presents for induction of labor for antiphospholipid antibody syndrome.  The patient was brought to [facility] for recurrent pregnancy loss, however conceived spontaneously.  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 She has been on prophylactic lovenox due to APA OB History     Gravida  4   Para  0   Term      Preterm      AB  3   Living         SAB  1   IAB  2   Ectopic      Multiple      Live Births           Obstetric Comments  Last pap: 10/26/16 Hx of abnormal pap: yes, in 2017, repeat normal History of STIs: denies Age at menarche: 72        Past Medical History:  Diagnosis Date   COVID-19 09/2020   History of molar pregnancy, antepartum 05/2019   Missed ab    Wears glasses    Past Surgical History:  Procedure Laterality Date   DILATION AND EVACUATION N/A 06/06/2019   Procedure: DILATATION AND EVACUATION;  Surgeon: Allyn Kenner, DO;  Location: [place];  Service: Gynecology;  Laterality: N/A;   HYSTEROSCOPY WITH D & C N/A 06/18/2020   Procedure: DILATATION AND EVACUATION;  Surgeon: Allyn Kenner, DO;  Location: [place];  Service: Gynecology;  Laterality: N/A;   KNEE SURGERY Right 2007   mass removed from under patella (per pt benign)   WISDOM TOOTH EXTRACTION  2011   Family History: family history includes Lupus in her maternal uncle; Uterine cancer in her paternal grandmother. Social History:  reports that she has never smoked. She has never used smokeless tobacco. She reports current alcohol use. She reports that she does not use drugs.     Maternal Diabetes: No Genetic Screening: Normal Maternal Ultrasounds/Referrals: Normal Fetal Ultrasounds or other Referrals:  None Maternal Substance Abuse:  No Significant  Maternal Medications:  Meds include: Syntroid Significant Maternal Lab Results:  Group B Strep positive Number of Prenatal Visits:greater than 3 verified prenatal visits Other Comments:  On [employer] carrier screen patient + for congenital disorder of glycosylation type 1A, Non-syndromic hearing loss GJB2 related, and Zellwegger spectrum disorder, FOB carrier testing negative  Review of Systems History Dilation: 1.5 Effacement (%): 50 Station: -3 Exam by:: Leanord Asal, RN Blood pressure 134/87, pulse 65, temperature 98.2 F (36.8 C), temperature source Oral, resp. rate 18, height 5\' 4"  (1.626 m), weight 91 kg, SpO2 99 %. Exam Physical Exam  AOx3, NAD Abd gravid FHR 130 reactive cat 1 tracing Toco irreg Prenatal labs: ABO, Rh: --/--/A POS (01/03 0040) Antibody: NEG (01/03 0040) Rubella: Immune (06/26 0000) RPR: NON REACTIVE (01/03 0040)  HBsAg: Negative (06/26 0000)  HIV: Non-reactive (06/26 0000)  GBS: Positive/-- (12/14 0000)   Assessment/Plan: 1) Admit 2) Cytotec 63mcg Q 4 hrs 3) Epidural on request 4) Pt desires full code status    Vanessa Kick 10/28/2022, 11:45 AM

## 2022-10-28 NOTE — Anesthesia Preprocedure Evaluation (Addendum)
Anesthesia Evaluation  Patient identified by MRN, date of birth, ID band Patient awake    Reviewed: Allergy & Precautions, Patient's Chart, lab work & pertinent test results  History of Anesthesia Complications Negative for: history of anesthetic complications  Airway Mallampati: II  TM Distance: >3 FB Neck ROM: Full    Dental  (+) Teeth Intact   Pulmonary neg pulmonary ROS   breath sounds clear to auscultation       Cardiovascular negative cardio ROS  Rhythm:Regular     Neuro/Psych negative neurological ROS  negative psych ROS   GI/Hepatic negative GI ROS, Neg liver ROS,,,  Endo/Other  negative endocrine ROS    Renal/GU negative Renal ROS     Musculoskeletal   Abdominal   Peds  Hematology  (+) Blood dyscrasia, anemia Lab Results      Component                Value               Date                      WBC                      9.1                 10/28/2022                HGB                      11.2 (L)            10/28/2022                HCT                      34.8 (L)            10/28/2022                MCV                      85.5                10/28/2022                PLT                      218                 10/28/2022             Antiphospholipid Ab on Lovenox (last dose 10/27/22 morning 40mg  qDay)   Anesthesia Other Findings   Reproductive/Obstetrics (+) Pregnancy                             Anesthesia Physical Anesthesia Plan  ASA: 2  Anesthesia Plan: Epidural   Post-op Pain Management: Epidural*   Induction:   PONV Risk Score and Plan: 2 and Treatment may vary due to age or medical condition  Airway Management Planned: Natural Airway  Additional Equipment: None  Intra-op Plan:   Post-operative Plan:   Informed Consent: I have reviewed the patients History and Physical, chart, labs and discussed the procedure including the risks, benefits and  alternatives for the proposed anesthesia with the patient or authorized representative who has  indicated his/her understanding and acceptance.       Plan Discussed with:   Anesthesia Plan Comments:        Anesthesia Quick Evaluation

## 2022-10-29 ENCOUNTER — Encounter (HOSPITAL_COMMUNITY): Payer: Self-pay | Admitting: Obstetrics and Gynecology

## 2022-10-29 MED ORDER — ACETAMINOPHEN 325 MG PO TABS: 650.0000 mg | ORAL_TABLET | ORAL | Status: DC | PRN

## 2022-10-29 MED ORDER — METHYLERGONOVINE MALEATE 0.2 MG/ML IJ SOLN: 0.2000 mg | INTRAMUSCULAR | Status: DC | PRN

## 2022-10-29 MED ORDER — BENZOCAINE-MENTHOL 20-0.5 % EX AERO: 1.0000 | INHALATION_SPRAY | CUTANEOUS | Status: DC | PRN

## 2022-10-29 MED ORDER — METHYLERGONOVINE MALEATE 0.2 MG PO TABS
0.2000 mg | ORAL_TABLET | ORAL | Status: DC | PRN
  Administered 2022-10-30: 0.2 mg via ORAL
  Filled 2022-10-29: qty 1

## 2022-10-29 MED ORDER — DIBUCAINE (PERIANAL) 1 % EX OINT: 1.0000 | TOPICAL_OINTMENT | CUTANEOUS | Status: DC | PRN

## 2022-10-29 MED ORDER — PRENATAL MULTIVITAMIN CH
1.0000 | ORAL_TABLET | Freq: Every day | ORAL | Status: DC
  Administered 2022-10-29 – 2022-10-30 (×2): 1 via ORAL
  Filled 2022-10-29 (×2): qty 1

## 2022-10-29 MED ORDER — COCONUT OIL OIL: 1.0000 | TOPICAL_OIL | Status: DC | PRN

## 2022-10-29 MED ORDER — ONDANSETRON HCL 4 MG PO TABS: 4.0000 mg | ORAL_TABLET | ORAL | Status: DC | PRN

## 2022-10-29 MED ORDER — SENNOSIDES-DOCUSATE SODIUM 8.6-50 MG PO TABS
2.0000 | ORAL_TABLET | ORAL | Status: DC
  Administered 2022-10-29: 2 via ORAL
  Filled 2022-10-29 (×3): qty 2

## 2022-10-29 MED ORDER — ONDANSETRON HCL 4 MG/2ML IJ SOLN: 4.0000 mg | INTRAMUSCULAR | Status: DC | PRN

## 2022-10-29 MED ORDER — TETANUS-DIPHTH-ACELL PERTUSSIS 5-2.5-18.5 LF-MCG/0.5 IM SUSY: 0.5000 mL | PREFILLED_SYRINGE | Freq: Once | INTRAMUSCULAR | Status: DC

## 2022-10-29 MED ORDER — MISOPROSTOL 200 MCG PO TABS
ORAL_TABLET | ORAL | Status: AC
  Filled 2022-10-29: qty 4

## 2022-10-29 MED ORDER — WITCH HAZEL-GLYCERIN EX PADS: 1.0000 | MEDICATED_PAD | CUTANEOUS | Status: DC | PRN

## 2022-10-29 MED ORDER — ZOLPIDEM TARTRATE 5 MG PO TABS: 5.0000 mg | ORAL_TABLET | Freq: Every evening | ORAL | Status: DC | PRN

## 2022-10-29 MED ORDER — ENOXAPARIN SODIUM 40 MG/0.4ML IJ SOSY
40.0000 mg | PREFILLED_SYRINGE | INTRAMUSCULAR | Status: DC
  Administered 2022-10-30: 40 mg via SUBCUTANEOUS
  Filled 2022-10-29: qty 0.4

## 2022-10-29 MED ORDER — METHYLERGONOVINE MALEATE 0.2 MG/ML IJ SOLN
INTRAMUSCULAR | Status: AC
  Administered 2022-10-29: 0.2 mg
  Filled 2022-10-29: qty 1

## 2022-10-29 MED ORDER — METHYLERGONOVINE MALEATE 0.2 MG PO TABS: 0.2000 mg | ORAL_TABLET | ORAL | Status: DC | PRN

## 2022-10-29 MED ORDER — IBUPROFEN 600 MG PO TABS
600.0000 mg | ORAL_TABLET | Freq: Four times a day (QID) | ORAL | Status: DC
  Administered 2022-10-29 – 2022-10-30 (×5): 600 mg via ORAL
  Filled 2022-10-29 (×5): qty 1

## 2022-10-29 MED ORDER — DIPHENHYDRAMINE HCL 25 MG PO CAPS: 25.0000 mg | ORAL_CAPSULE | Freq: Four times a day (QID) | ORAL | Status: DC | PRN

## 2022-10-29 MED ORDER — TRANEXAMIC ACID-NACL 1000-0.7 MG/100ML-% IV SOLN
INTRAVENOUS | Status: AC
  Administered 2022-10-29: 1000 mg
  Filled 2022-10-29: qty 100

## 2022-10-29 MED ORDER — TRANEXAMIC ACID-NACL 1000-0.7 MG/100ML-% IV SOLN: 1000.0000 mg | INTRAVENOUS | Status: AC

## 2022-10-29 MED ORDER — SIMETHICONE 80 MG PO CHEW: 80.0000 mg | CHEWABLE_TABLET | ORAL | Status: DC | PRN

## 2022-10-29 NOTE — Lactation Note (Signed)
This note was copied from a baby's chart. Lactation Consultation Note  Patient Name: Betty Chung HWEXH'B Date: 10/29/2022   Age:32 hours   LC Note:  Attempted to visit with family, however, many visitors were present and family was face timing; will return later today.   Maternal Data Does the patient have breastfeeding experience prior to this delivery?: No  Feeding    LATCH Score                    Lactation Tools Discussed/Used    Interventions    Discharge    Consult Status       R  10/29/2022, 1:44 PM

## 2022-10-29 NOTE — Progress Notes (Addendum)
Patient ID: Betty Chung, female   DOB: 1991-04-14, 32 y.o.   MRN: [id number] Lochia normal and no further issues with bleeding. Will pull epidural and send to postpartum Restart lovenox in 24 hours

## 2022-10-29 NOTE — Progress Notes (Signed)
Patient ID: Betty Chung, female   DOB: Jun 26, 1991, 32 y.o.   MRN: [id number] Pt has had protracted labor course, notes some back pain intermittently  Afeb VSS FHR Category 1  Cervix c/c/+1-+2  Push attempted and good effort noted Will start pushing

## 2022-10-29 NOTE — Lactation Note (Signed)
This note was copied from a baby's chart. Lactation Consultation Note  Patient Name: Betty Chung JJHER'D Date: 10/29/2022 Reason for consult: Mother's request;Follow-up assessment;1st time breastfeeding;Term Age:32 hours Birth Parent requested latch assistance, RN help assist with latch prior to [facility] entering the room, [place] assisted with latching infant on Birth Parent's right breast using the football hold position, LC duscussed using pillow support to bring infant chest to breast in even and parallel position, infant was on and off breast but started sustaining longer than previously Per Birth Parent, infant BF for 18 minutes with RN and [place] assistance. LC reviewed how to use hand pump and pre-pump breast with hand pump prior to latching infant at the breast. Birth Parent will continue to work towards latching infant at the breast and ask for further latch assistance if needed. Birth Parent will continue to BF infant according to hunger cues, on demand, STS.  Maternal Data    Feeding Mother's Current Feeding Choice: Breast Milk  LATCH Score Latch: Repeated attempts needed to sustain latch, nipple held in mouth throughout feeding, stimulation needed to elicit sucking reflex.  Audible Swallowing: A few with stimulation  Type of Nipple: Everted at rest and after stimulation (short shafted given hand pump to pre-pump breast prior to latch by RN and breast shells.)  Comfort (Breast/Nipple): Soft / non-tender  Hold (Positioning): Assistance needed to correctly position infant at breast and maintain latch.  LATCH Score: 7   Lactation Tools Discussed/Used    Interventions Interventions: Support pillows;Position options;Adjust position;Skin to skin;Breast compression;Hand pump;Education  Discharge    Consult Status Consult Status: Follow-up Date: 10/30/22 Follow-up type: In-patient    Eulis Canner 10/29/2022, 9:58 PM

## 2022-10-29 NOTE — Lactation Note (Signed)
This note was copied from a baby's chart. Lactation Consultation Note  Patient Name: Betty Chung GGEZM'O Date: 10/29/2022 Reason for consult: Follow-up assessment;Primapara;1st time breastfeeding;Term Age:32 hours   P1: Term infant at 39+1 weeks Feeding preference: Breast  Mother requested latch assistance.  "[name]"  was asleep STS on mother's chest when I arrived.  Offered to assist with waking and latching; mother receptive.  Attempted to awaken "[name]" and latch, however, he was not at all interested.  Encouraged to continue STS, breast massage and hand expression and to finger feed drops to baby when available.  Mother aware that sleepiness is to be expected at this age.  Support person and visitors present.   Maternal Data Has patient been taught Hand Expression?: Yes Does the patient have breastfeeding experience prior to this delivery?: No  Feeding Mother's Current Feeding Choice: Breast Milk  LATCH Score Latch: Too sleepy or reluctant, no latch achieved, no sucking elicited.  Audible Swallowing: None  Type of Nipple: Everted at rest and after stimulation  Comfort (Breast/Nipple): Soft / non-tender  Hold (Positioning): Assistance needed to correctly position infant at breast and maintain latch.  LATCH Score: 5   Lactation Tools Discussed/Used    Interventions Interventions: Breast feeding basics reviewed;Assisted with latch;Skin to skin;Breast massage;Hand express;Breast compression;Position options;Support pillows;Adjust position;Education  Discharge Pump: Personal  Consult Status Consult Status: Follow-up Date: 10/30/22 Follow-up type: In-patient    Little Ishikawa 10/29/2022, 5:28 PM

## 2022-10-29 NOTE — Lactation Note (Signed)
This note was copied from a baby's chart. Lactation Consultation Note  Patient Name: Betty Chung FOYDX'A Date: 10/29/2022 Reason for consult: Initial assessment;1st time breastfeeding;Primapara;Term Age:32 hours   P1: Term infant at 39+1 weeks Feeding preference: Breast  "[name]" was asleep on mother's chest when I arrived.  He has not breast fed since birth even though mother has attempted.  Offered to assist with waking and latching; mother receptive.  Taught hand expression; no colostrum noted.  Assisted to latch, however, "[name]" was not interested in initiating a suck.  Demonstrated breast compressions and gentle stimulation.  Repeated attempts were made.  Placed him STS on mother's chest where he fell asleep.  Encouraged to do lots of STS, breast massage and hand expression.  Suggested she call her RN/LC for latch assistance as needed.  Support person and visitors present.   Maternal Data Has patient been taught Hand Expression?: Yes Does the patient have breastfeeding experience prior to this delivery?: No  Feeding Mother's Current Feeding Choice: Breast Milk  LATCH Score Latch: Too sleepy or reluctant, no latch achieved, no sucking elicited.  Audible Swallowing: None  Type of Nipple: Everted at rest and after stimulation  Comfort (Breast/Nipple): Soft / non-tender  Hold (Positioning): Assistance needed to correctly position infant at breast and maintain latch.  LATCH Score: 5   Lactation Tools Discussed/Used    Interventions Interventions: Breast feeding basics reviewed;Assisted with latch;Skin to skin;Breast massage;Hand express;Breast compression;Support pillows;Position options;Adjust position;Education;LC Services brochure  Discharge Pump: Personal  Consult Status Consult Status: Follow-up Date: 10/30/22 Follow-up type: In-patient     R  10/29/2022, 4:09 PM

## 2022-10-29 NOTE — Progress Notes (Signed)
Report called to receiving nurse Lenna Sciara RN). Will transport to mother-baby unit room 410 within the next 30 minutes. Patient's fundus remains firm at U w/small amount of lochia. IV site remains patent and without erythema or edema.

## 2022-10-30 LAB — CBC
HCT: 25.9 % — ABNORMAL LOW (ref 36.0–46.0)
Hemoglobin: 8.6 g/dL — ABNORMAL LOW (ref 12.0–15.0)
MCH: 28.1 pg (ref 26.0–34.0)
MCHC: 33.2 g/dL (ref 30.0–36.0)
MCV: 84.6 fL (ref 80.0–100.0)
Platelets: 159 10*3/uL (ref 150–400)
RBC: 3.06 MIL/uL — ABNORMAL LOW (ref 3.87–5.11)
RDW: 14.6 % (ref 11.5–15.5)
WBC: 15 10*3/uL — ABNORMAL HIGH (ref 4.0–10.5)
nRBC: 0 % (ref 0.0–0.2)

## 2022-10-30 LAB — BIRTH TISSUE RECOVERY COLLECTION (PLACENTA DONATION)

## 2022-10-30 MED ORDER — IBUPROFEN 600 MG PO TABS: 600.0000 mg | ORAL_TABLET | Freq: Four times a day (QID) | ORAL | 1 refills | Status: DC | PRN

## 2022-10-30 MED ORDER — LEVOTHYROXINE SODIUM 50 MCG PO TABS
50.0000 ug | ORAL_TABLET | Freq: Every day | ORAL | Status: DC
  Administered 2022-10-30: 50 ug via ORAL
  Filled 2022-10-30: qty 1

## 2022-10-30 NOTE — Discharge Summary (Signed)
Postpartum Discharge Summary  Date of Service updated      Patient Name: Marca Gadsby DOB: Mar 30, 1991 MRN: [id number]  Date of admission: 10/28/2022 Delivery date:10/29/2022  Delivering provider: Paula Compton  Date of discharge: 10/30/2022  Admitting diagnosis: Pregnant and not yet delivered [Z34.90] NSVD (normal spontaneous vaginal delivery) [O80] Intrauterine pregnancy: [redacted]w[redacted]d     Secondary diagnosis:  Principal Problem:   Pregnant and not yet delivered Active Problems:   NSVD (normal spontaneous vaginal delivery)  Additional problems: APA    Discharge diagnosis: Term Pregnancy Delivered and Anemia  - of acute blood loss                                             Post partum procedures: n/a Augmentation: Cytotec Complications: [id number]  Hospital course: Induction of Labor With Vaginal Delivery   32 y.o. yo [id number] at [redacted]w[redacted]d was admitted to the hospital 10/28/2022 for induction of labor.  Indication for induction: Elective.  Patient had an labor course complicated by protracted labor  Membrane Rupture Time/Date: 8:00 PM ,10/28/2022   Delivery Method:Vaginal, Spontaneous  Episiotomy: None  Lacerations:  2nd degree  Details of delivery can be found in separate delivery note.  Patient had a postpartum course complicated by pp hemorrhage. Patient is discharged home 10/30/22.  Newborn Data: Birth date:10/29/2022  Birth time:9:07 AM  Gender:Female  Living status:Living  Apgars:8 ,9  Weight:3700 g   Magnesium Sulfate received: No BMZ received: No  Vitals:   10/29/22 1305 10/29/22 1800 10/29/22 2100 10/30/22 0645  BP: 132/72 118/69 101/73 109/75  Pulse: 65 83 87 84  Resp: 16 16 16 16   Temp: 97.9 F (36.6 C) 98.3 F (36.8 C) 98.5 F (36.9 C) 98 F (36.7 C)  TempSrc: Oral  Axillary Oral  SpO2: 99% 99% 100% 100%  Weight:      Height:       General: alert, cooperative, and no distress Lochia: appropriate Uterine Fundus: firm Incision: N/A DVT Evaluation:  No evidence of DVT seen on physical exam. Labs: Lab Results  Component Value Date   WBC 15.0 (H) 10/30/2022   HGB 8.6 (L) 10/30/2022   HCT 25.9 (L) 10/30/2022   MCV 84.6 10/30/2022   PLT 159 10/30/2022       No data to display         Edinburgh Score:     No data to display            After visit meds:  Allergies as of 10/30/2022   No Known Allergies      Medication List     STOP taking these medications    azithromycin 250 MG tablet Commonly known as: ZITHROMAX       TAKE these medications    enoxaparin 40 MG/0.4ML injection Commonly known as: LOVENOX Inject 40 mg into the skin daily.   ibuprofen 600 MG tablet Commonly known as: ADVIL Take 1 tablet (600 mg total) by mouth every 6 (six) hours as needed for moderate pain or cramping.   levothyroxine 50 MCG tablet Commonly known as: SYNTHROID Take 50 mcg by mouth daily before breakfast.         Discharge home in stable condition Infant Feeding: Breast Infant Disposition:home with mother Discharge instruction: per After Visit Summary and Postpartum booklet. Activity: Advance as tolerated. Pelvic rest for 6 weeks.  Diet: routine diet  Anticipated Birth Control: Unsure Postpartum Appointment:6 weeks Additional Postpartum F/U: Postpartum Depression checkup Future Appointments:No future appointments. Follow up Visit:  Follow-up Information     Ob/Gyn, [facility]. Schedule an appointment as soon as possible for a visit in 6 week(s).   Why: 6 weeks for postpartum visit Contact information: [address] [place] [address] [phone]                     10/30/2022 Isaiah Serge, DO

## 2022-10-30 NOTE — Lactation Note (Signed)
This note was copied from a baby's chart. Lactation Consultation Note  Patient Name: Betty Chung OZDGU'Y Date: 10/30/2022 Age: 32 weeks  Reason for consult: Follow-up assessment;1st time breastfeeding;Primapara;Term;Breastfeeding assistance;Infant weight loss (post circ, sleepy)  Lactation Consultation visit to assess infant breastfeeding. Infant had a circumcision 2 hours ago. He is sleepy and does not show feeding efforts at this time. Baby was placed skin to skin. Mother states baby had been latching on and off for 15 minutes prior to circumcision, however, he has not been sustaining latch consistently. Parents asked appropriate questions regarding evidence of good feedings/intake, frequency of feeding and alternatives if baby does not feed at the breast. Mother will use breast shells, pre-pump with hand pump to initiate latch and watch for feeding cues. Mother to call for assistance and observation for latch with next feeding.    Maternal Data  Mother has had positive breast changes this pregnancy and colostrum present with hand expression      Lactation Tools Discussed/Used Tools: Pump;Shells;Flanges Flange Size: 24 Breast pump type: Manual Reason for Pumping: pre-pump due to areloa edema, promote nipple evertions for deeper latch  Interventions Interventions: Breast feeding basics reviewed;Assisted with latch;Breast massage;Hand express;Reverse pressure;Adjust position;Hand pump;Education  Discharge    Consult Status Consult Status: Follow-up Date: 10/30/22 Follow-up type: In-patient    Stana Bunting M 10/30/2022, 2:49 PM

## 2022-10-30 NOTE — Anesthesia Postprocedure Evaluation (Signed)
Anesthesia Post Note  Patient: Jashawna Reever  Procedure(s) Performed: AN AD [place]     Patient location during evaluation: Mother Baby Anesthesia Type: Epidural Level of consciousness: awake, oriented and awake and alert Pain management: pain level controlled Vital Signs Assessment: post-procedure vital signs reviewed and stable Respiratory status: spontaneous breathing, respiratory function stable and nonlabored ventilation Cardiovascular status: stable Postop Assessment: no headache, adequate PO intake, able to ambulate, patient able to bend at knees and no apparent nausea or vomiting Anesthetic complications: no   No notable events documented.  Last Vitals:  Vitals:   10/29/22 2100 10/30/22 0645  BP: 101/73 109/75  Pulse: 87 84  Resp: 16 16  Temp: 36.9 C 36.7 C  SpO2: 100% 100%    Last Pain:  Vitals:   10/30/22 0725  TempSrc:   PainSc: 0-No pain   Pain Goal: Patients Stated Pain Goal: 3 (10/29/22 1478)                 Marcy Siren

## 2022-10-30 NOTE — Progress Notes (Addendum)
Post Partum Day 1 Subjective: no complaints, up ad lib, voiding, tolerating PO, + flatus, and lochia mild. She reports LE edema improving with ted socks. She denies lightheadedness, HA, CP or SOB. She is bonding well with baby. She would like discharge home later today after baby circumcised   Objective: Blood pressure 109/75, pulse 84, temperature 98 F (36.7 C), temperature source Oral, resp. rate 16, height 5\' 4"  (1.626 m), weight 91 kg, SpO2 100 %, unknown if currently breastfeeding.  Physical Exam:  General: alert, cooperative, and no distress Lochia: appropriate Uterine Fundus: firm Incision: n/a DVT Evaluation: No evidence of DVT seen on physical exam.  Recent Labs    10/28/22 0040 10/30/22 0439  HGB 11.2* 8.6*  HCT 34.8* 25.9*    Assessment/Plan: Discharge home, Breastfeeding, and Circumcision prior to discharge Instructions reviewed  Contineu on lovenox for APA x 6 week pp  Pt understands that neonatal circumcision is not considered medically necessary and is elective. The risks include, but are not limited to bleeding, infection, damage to the penis, development of scar tissue, and having to have it redone at a later date. Pt understands theses risks and wishes to proceed   LOS: 2 days    W , DO 10/30/2022, 9:23 AM

## 2022-10-30 NOTE — Lactation Note (Addendum)
This note was copied from a baby's chart. Lactation Consultation Note  Patient Name: Betty Chung [id number] Date: 10/30/2022 Age: 32 hrs  Reason for consult: Follow-up assessment;1st time breastfeeding;Primapara;Term;Mother's request  Parents called LC to room because baby was awake and giving feeding cues. Assisted mother with positioning, latch and holding infant while looking for good signs of attachment. Few attempts for mother/ baby to coordinate latching. Mother and father, along with additional support systems, were very attentive to education. Infant fed for 20 minutes, rhythmically with swallows noted. Mother demonstrated alternate breast massage and stimulation to keep baby engaged during the feeding. Baby spontaneously came off breast and nipple rounded and intact.   Parents asked questions regarding supplementation, if needed. Advised to pump to stimulate production, feed baby any expressed colostrum/ milk via spoon and keep a tally of voids and stools. If baby refuses feedings at breast or falls asleep without feeding actively for 15 minutes, then supplement using a bottle to maintain calorie intact. Pediatrician follow up on 11/02/2022.        LATCH Score Latch: Repeated attempts needed to sustain latch, nipple held in mouth throughout feeding, stimulation needed to elicit sucking reflex. (once latched well audible swallows and sustained, 2-3 times attempted prior to deep latch, mother guiding latch)  Audible Swallowing: Spontaneous and intermittent  Type of Nipple: Everted at rest and after stimulation  Comfort (Breast/Nipple): Soft / non-tender  Hold (Positioning): Assistance needed to correctly position infant at breast and maintain latch.  LATCH Score: 8   Lactation Tools Discussed/Used Tools: Pump;Shells;Flanges Flange Size: 24 Breast pump type: Manual Reason for Pumping: pre-pump due to areloa edema, promote nipple evertions for deeper latch Pumping  frequency: Mother pre-pumped prior to latch, nipple everted, colostrum expressed  Interventions Interventions: Breast feeding basics reviewed;Assisted with latch;Skin to skin;Breast massage;Hand express;Breast compression;Adjust position;Support pillows;Education;LC Services brochure  Discharge Discharge Education: Warning signs for feeding baby;Engorgement and breast care;Other (comment) (OP Lactation support and services information sheet given)  Consult Status Consult Status: Complete Date: 10/30/22 Follow-up type: In-patient    Stana Bunting M 10/30/2022, 4:12 PM

## 2022-10-30 NOTE — Discharge Instructions (Signed)
Call office with any concerns [phone] 

## 2022-11-05 ENCOUNTER — Telehealth (HOSPITAL_COMMUNITY): Payer: Self-pay

## 2022-11-05 NOTE — Telephone Encounter (Signed)
Patient did not answer phone call. Voicemail left for patient.   Betty Chung [facility] 11/05/22,1835

## 2022-11-15 ENCOUNTER — Encounter (HOSPITAL_COMMUNITY): Payer: Self-pay | Admitting: Obstetrics and Gynecology

## 2022-11-15 ENCOUNTER — Inpatient Hospital Stay (HOSPITAL_COMMUNITY)
Admission: AD | Admit: 2022-11-15 | Discharge: 2022-11-15 | Disposition: A | Discharge status: Home / Self Care | Payer: BC Managed Care – PPO | Attending: Obstetrics and Gynecology | Admitting: Obstetrics and Gynecology

## 2022-11-15 ENCOUNTER — Inpatient Hospital Stay (HOSPITAL_COMMUNITY): Admit: 2022-11-15 | Payer: BC Managed Care – PPO | Admitting: Obstetrics and Gynecology

## 2022-11-15 ENCOUNTER — Other Ambulatory Visit: Payer: Self-pay

## 2022-11-15 ENCOUNTER — Inpatient Hospital Stay (HOSPITAL_COMMUNITY): Payer: BC Managed Care – PPO

## 2022-11-15 DIAGNOSIS — D62 Acute posthemorrhagic anemia: Secondary | ICD-10-CM

## 2022-11-15 LAB — TYPE AND SCREEN
ABO/RH(D): A POS
Antibody Screen: NEGATIVE

## 2022-11-15 LAB — CBC
HCT: 29.2 % — ABNORMAL LOW (ref 36.0–46.0)
Hemoglobin: 9.2 g/dL — ABNORMAL LOW (ref 12.0–15.0)
MCH: 27.1 pg (ref 26.0–34.0)
MCHC: 31.5 g/dL (ref 30.0–36.0)
MCV: 85.9 fL (ref 80.0–100.0)
Platelets: 327 10*3/uL (ref 150–400)
RBC: 3.4 MIL/uL — ABNORMAL LOW (ref 3.87–5.11)
RDW: 13.8 % (ref 11.5–15.5)
WBC: 7 10*3/uL (ref 4.0–10.5)
nRBC: 0 % (ref 0.0–0.2)

## 2022-11-15 LAB — HEMOGLOBIN AND HEMATOCRIT, BLOOD
HCT: 29.2 % — ABNORMAL LOW (ref 36.0–46.0)
Hemoglobin: 9.3 g/dL — ABNORMAL LOW (ref 12.0–15.0)

## 2022-11-15 MED ORDER — SODIUM CHLORIDE 0.9 % IV BOLUS
1000.0000 mL | Freq: Once | INTRAVENOUS | Status: AC
  Administered 2022-11-15: 1000 mL via INTRAVENOUS

## 2022-11-15 MED ORDER — TRANEXAMIC ACID-NACL 1000-0.7 MG/100ML-% IV SOLN
1000.0000 mg | Freq: Once | INTRAVENOUS | Status: AC
  Administered 2022-11-15: 1000 mg via INTRAVENOUS
  Filled 2022-11-15: qty 100

## 2022-11-15 MED ORDER — METHYLERGONOVINE MALEATE 0.2 MG PO TABS: 0.2000 mg | ORAL_TABLET | Freq: Four times a day (QID) | ORAL | 0 refills | Status: DC

## 2022-11-15 MED ORDER — KETOROLAC TROMETHAMINE 30 MG/ML IJ SOLN
30.0000 mg | Freq: Once | INTRAMUSCULAR | Status: AC
  Administered 2022-11-15: 30 mg via INTRAVENOUS
  Filled 2022-11-15: qty 1

## 2022-11-15 MED ORDER — METHYLERGONOVINE MALEATE 0.2 MG/ML IJ SOLN
0.2000 mg | Freq: Once | INTRAMUSCULAR | Status: AC
  Administered 2022-11-15: 0.2 mg via INTRAMUSCULAR
  Filled 2022-11-15: qty 1

## 2022-11-15 MED ORDER — KETOROLAC TROMETHAMINE 10 MG PO TABS: 10.0000 mg | ORAL_TABLET | Freq: Four times a day (QID) | ORAL | 0 refills | Status: DC | PRN

## 2022-11-15 NOTE — MAU Note (Signed)
.  Betty Chung is a 32 y.o. PP delivered 10/29/22 here in MAU reporting: heavy vag bleeding that started yesterday.  Reports passing several large clots and is still actively bleeding. Denies pain   Onset of complaint: 01/21  Vitals:   11/15/22 0749 11/15/22 0750  BP:  119/71  Pulse:  (!) 101  Resp:  15  Temp:  97.8 F (36.6 C)  SpO2: 100%       L

## 2022-11-15 NOTE — MAU Provider Note (Signed)
Chief Complaint: Vaginal Bleeding   Event Date/Time   First Provider Initiated Contact with Patient 11/15/22 [phone]     SUBJECTIVE HPI: Betty Chung is a 32 y.o. [id number] at 2 wks 3 days postpartum SVD no 10/29/22 who presents to [facility] Admissions reporting heavy vaginal and passing clots. On on Lovenox 40 mg QD for Hx Antiphospholipid Syndrome. Had immediate postpartum hemorrhage 1 L managed with Methergine TXA, Cytotec. Hgb dropped 11.3>8.2.   Bleeding had decreased to scant, only needing to wear a panty liner. First episode of heavy bleeding last night. Called provider who told her to go to hospital in she soaked a pad in < 1 hr, but bleeding slowed and pt stayed at home. This morning she had more heavy bleeding, passed large clots and was feeling dizzy. Called EMS.   Associated signs and symptoms: Pos for dizziness, Neg for fever, chills, syncope. Mild cramping when passing clots.   Past Medical History:  Diagnosis Date   COVID-19 09/2020   History of molar pregnancy, antepartum 05/2019   Missed ab    Wears glasses    OB History  Gravida Para Term Preterm AB Living  4 1 1   3 1   SAB IAB Ectopic Multiple Live Births  1 2   0 1    # Outcome Date GA Lbr Len/2nd Weight Sex Delivery Anes PTL Lv  4 Term 10/29/22 [redacted]w[redacted]d / 01:27 3700 g M Vag-Spont EPI  LIV  3 IAB           2 IAB           1 SAB             Obstetric Comments  Last pap: 10/26/16  Hx of abnormal pap: yes, in 2017, repeat normal  History of STIs: denies  Age at menarche: 43   Past Surgical History:  Procedure Laterality Date   DILATION AND EVACUATION N/A 06/06/2019   Procedure: DILATATION AND EVACUATION;  Surgeon: Allyn Kenner, DO;  Location: [facility];  Service: Gynecology;  Laterality: N/A;   HYSTEROSCOPY WITH D & C N/A 06/18/2020   Procedure: DILATATION AND EVACUATION;  Surgeon: Allyn Kenner, DO;  Location: [facility];  Service: Gynecology;  Laterality: N/A;   KNEE  SURGERY Right 2007   mass removed from under patella (per pt benign)   WISDOM TOOTH EXTRACTION  2011   Social History   Socioeconomic History   Marital status: Single    Spouse name: Not on file   Number of children: Not on file   Years of education: Not on file   Highest education level: Not on file  Occupational History   Not on file  Tobacco Use   Smoking status: Never   Smokeless tobacco: Never  Vaping Use   Vaping Use: Never used  Substance and Sexual Activity   Alcohol use: Yes   Drug use: Never   Sexual activity: Yes    Birth control/protection: None  Other Topics Concern   Not on file  Social History Narrative   Not on file   Social Determinants of Health   Financial Resource Strain: Not on file  Food Insecurity: No Food Insecurity (10/28/2022)   Hunger Vital Sign    Worried About Running Out of Food in the Last Year: Never true    Ran Out of Food in the Last Year: Never true  Transportation Needs: No Transportation Needs (10/28/2022)   PRAPARE - Transportation    Lack of Transportation (  Medical): No    Lack of Transportation (Non-Medical): No  Physical Activity: Not on file  Stress: Not on file  Social Connections: Not on file  Intimate Partner Violence: Not At Risk (10/28/2022)   Humiliation, Afraid, Rape, and Kick questionnaire    Fear of Current or Ex-Partner: No    Emotionally Abused: No    Physically Abused: No    Sexually Abused: No   Family History  Problem Relation Age of Onset   Lupus Maternal Uncle    Uterine cancer Paternal Grandmother    No current facility-administered medications on file prior to encounter.   Current Outpatient Medications on File Prior to Encounter  Medication Sig Dispense Refill   enoxaparin (LOVENOX) 40 MG/0.4ML injection Inject 40 mg into the skin in the morning.     levothyroxine (SYNTHROID) 50 MCG tablet Take 50 mcg by mouth daily before breakfast.     aspirin EC 81 MG tablet Take 81 mg by mouth at bedtime. Swallow  whole.     ibuprofen (ADVIL) 600 MG tablet Take 1 tablet (600 mg total) by mouth every 6 (six) hours as needed for moderate pain or cramping. 40 tablet 1   MAGNESIUM OXIDE PO Take 1 tablet by mouth at bedtime.     Prenatal Vit-Fe Fumarate-FA (PRENATAL PO) Take 1 tablet by mouth at bedtime.     No Known Allergies  I have reviewed patient's Past Medical Hx, Surgical Hx, Family Hx, Social Hx, medications and allergies.   Review of Systems  Constitutional:  Negative for chills and fever.  Gastrointestinal:  Negative for abdominal pain (mild).  Genitourinary:  Positive for vaginal bleeding. Negative for vaginal discharge.  Neurological:  Positive for dizziness. Negative for syncope.    OBJECTIVE Patient Vitals for the past 24 hrs:  BP Temp Temp src Pulse Resp SpO2 Height Weight  11/15/22 0750 119/71 97.8 F (36.6 C) Oral (!) 101 15 -- 5\' 4"  (1.626 m) 80.3 kg  11/15/22 0749 -- -- -- -- -- 100 % -- --   Orthostatic VS for the past 24 hrs (Last 3 readings):  BP- Lying Pulse- Lying BP- Sitting Pulse- Sitting BP- Standing at 0 minutes Pulse- Standing at 0 minutes BP- Standing at 3 minutes Pulse- Standing at 3 minutes  11/15/22 0845 -- -- -- -- -- -- 113/83 122  11/15/22 0842 -- -- -- -- 121/70 113 -- --  11/15/22 0841 -- -- 118/80 87 -- -- -- --  11/15/22 0836 113/75 81 -- -- -- -- -- --   Orthostatic VS for the past 24 hrs (Last 3 readings):  BP- Lying Pulse- Lying BP- Sitting Pulse- Sitting BP- Standing at 0 minutes Pulse- Standing at 0 minutes BP- Standing at 3 minutes Pulse- Standing at 3 minutes  11/15/22 1426 -- -- -- -- -- -- 114/76 89  11/15/22 1425 -- -- -- -- 113/75 82 -- --  11/15/22 1421 -- -- 94/45 110 -- -- -- --  11/15/22 1420 115/72 78 -- -- -- -- -- --  11/15/22 1204 -- -- -- -- -- -- 119/83 95  11/15/22 1203 -- -- -- -- 123/82 93 -- --  11/15/22 1201 -- -- 111/71 76 -- -- -- --  11/15/22 1200 120/69 71 -- -- -- -- -- --     Constitutional: Well-developed,  well-nourished female in no acute distress.  Skin: Pale Cardiovascular: Mildly tachycardic Respiratory: normal rate and effort.  GI: Abd soft, non-tender, Fundus 2/SP.  MS: Extremities nontender, no edema, normal ROM  Neurologic: Alert and oriented x 4.  GU:  SPECULUM EXAM: NEFG, physiologic discharge, moderate amount of bright red blood noted, cervix clean, visually closed  BIMANUAL: cervix closed; uterus 14 week size, no adnexal tenderness or masses. No CMT.  LAB RESULTS Results for orders placed or performed during the hospital encounter of 11/15/22 (from the past 24 hour(s))  CBC     Status: Abnormal   Collection Time: 11/15/22  8:12 AM  Result Value Ref Range   WBC 7.0 4.0 - 10.5 K/uL   RBC 3.40 (L) 3.87 - 5.11 MIL/uL   Hemoglobin 9.2 (L) 12.0 - 15.0 g/dL   HCT 29.2 (L) 36.0 - 46.0 %   MCV 85.9 80.0 - 100.0 fL   MCH 27.1 26.0 - 34.0 pg   MCHC 31.5 30.0 - 36.0 g/dL   RDW 13.8 11.5 - 15.5 %   Platelets 327 150 - 400 K/uL   nRBC 0.0 0.0 - 0.2 %  Type and screen     Status: None   Collection Time: 11/15/22  8:14 AM  Result Value Ref Range   ABO/RH(D) A POS    Antibody Screen NEG    Sample Expiration      11/18/2022,2359 Performed at [facility], [place] [address] [place], [place] [address]     IMAGING US PELVIS (TRANSABDOMINAL ONLY)  Result Date: 11/15/2022 CLINICAL DATA:  Postpartum bleeding.  Vaginal delivery 10/29/2022. EXAM: TRANSABDOMINAL ULTRASOUND OF PELVIS TECHNIQUE: Transabdominal ultrasound examination of the pelvis was performed including evaluation of the uterus, ovaries, adnexal regions, and pelvic cul-de-sac. COMPARISON:  None Available. FINDINGS: Uterus Measurements: 13.7 x 6.1 x 8.3 cm = volume: 360 mL. No fibroids or other mass visualized. Endometrium Thickness: 25.4 mm. Thickened heterogeneous endometrium with minimal internal vascularity. Findings likely due to moderate hemorrhagic debris/clot and less likely retained products of conception or  endometritis. Right ovary Measurements: 2.4 x 2.0 x 1.2 cm = volume: 2.9 mL. Normal appearance/no adnexal mass. Left ovary Measurements: 4 x 2.5 x 3.1 cm = volume: 9.6 mL. Normal appearance/no adnexal mass. Other findings:  No abnormal free fluid. IMPRESSION: Thickened heterogeneous endometrium measuring 25.4 mm with minimal internal vascularity. Findings likely due to moderate hemorrhagic debris/clot and less likely retained products of conception or endometritis. Electronically Signed   By: Marin Olp M.D.   On: 11/15/2022 09:32    MAU COURSE/MDM Orders Placed This Encounter  Procedures   US PELVIS (TRANSABDOMINAL ONLY)   CBC   Hemoglobin and hematocrit, blood   Diet NPO time specified   Orthostatic vital signs   Orthostatic vital signs   Type and screen   Insert peripheral IV   Meds ordered this encounter  Medications   sodium chloride 0.9 % bolus 1,000 mL   tranexamic acid (CYKLOKAPRON) IVPB 1,000 mg   methylergonovine (METHERGINE) injection 0.2 mg   sodium chloride 0.9 % bolus 1,000 mL   Reviewed [place] labs exam with Dr. Roselie Awkward. [place] likely shows clot and not POCs. Bleeding possibly due to shedding of eschar exacerbated by anticoagulation therapy.   Pt is orthostatic. Bleeding moderate, stable. Hgb mildly low but increased from immediately postpartum. Pt received 1 L Saline, TXA and Methergine. D&C not indicated at this time. Will observe and recheck H&H, orthostatics and given second liter fluid bolus. Stay NPO in case D&C becomes indicated.   Pt ambulating without dizziness, no longer orthostatic after 2 liters of fluid. Small amount of bleeding during rest of MAU stay. Discussed with Dr. Kennon Rounds. Pt appropriate  for D/C on Methergine with close F/U in office. Hold Lovenox x 3 days. PO iron QOD and increase iron in diet.   ASSESSMENT 1. Delay postpartum hemorrhage   2. Anemia associated with acute blood loss    PLAN Discharge home in stable condition. Bleeding precautions Hold  Lovenox for 3 days. F/U with [facility]yn this week MAU PRN for worsening Sx.  Allergies as of 11/15/2022   No Known Allergies      Medication List     STOP taking these medications    aspirin EC 81 MG tablet       TAKE these medications    enoxaparin 40 MG/0.4ML injection Commonly known as: LOVENOX Inject 40 mg into the skin in the morning.   ibuprofen 600 MG tablet Commonly known as: ADVIL Take 1 tablet (600 mg total) by mouth every 6 (six) hours as needed for moderate pain or cramping.   ketorolac 10 MG tablet Commonly known as: TORADOL Take 1 tablet (10 mg total) by mouth every 6 (six) hours as needed.   levothyroxine 50 MCG tablet Commonly known as: SYNTHROID Take 50 mcg by mouth daily before breakfast.   MAGNESIUM OXIDE PO Take 1 tablet by mouth at bedtime.   methylergonovine 0.2 MG tablet Commonly known as: METHERGINE Take 1 tablet (0.2 mg total) by mouth 4 (four) times daily.   PRENATAL PO Take 1 tablet by mouth at bedtime.         Tamala Julian, [place], [place] 11/15/2022  11:22 AM

## 2022-11-15 NOTE — Discharge Instructions (Addendum)
Do not take your Lovenox for three days

## 2022-12-22 ENCOUNTER — Ambulatory Visit
Admission: RE | Admit: 2022-12-22 | Discharge: 2022-12-22 | Disposition: A | Discharge status: Home / Self Care | Payer: BC Managed Care – PPO | Source: Ambulatory Visit | Attending: Family Medicine | Admitting: Family Medicine

## 2022-12-22 VITALS — BP 116/72 | HR 81 | Temp 98.0°F | Resp 18

## 2022-12-22 DIAGNOSIS — N760 Acute vaginitis: Secondary | ICD-10-CM | POA: Diagnosis present

## 2022-12-22 LAB — POCT URINE PREGNANCY: Preg Test, Ur: NEGATIVE

## 2022-12-22 MED ORDER — FLUCONAZOLE 150 MG PO TABS: 150.0000 mg | ORAL_TABLET | ORAL | 0 refills | Status: AC

## 2022-12-22 NOTE — Discharge Instructions (Signed)
The pregnancy test was negative.  Take fluconazole 150 mg--1 tablet every 3 days for 2 doses  Staff will notify you of any positive tests on the swab

## 2022-12-22 NOTE — ED Provider Notes (Addendum)
[facility]    CSN: [id number] Arrival date & time: 12/22/22  1252      History   Chief Complaint Chief Complaint  Patient presents with   Vaginal Itching    Entered by patient    HPI Betty Chung is a 32 y.o. female.    Vaginal Itching   Here for vaginal itching that is been going on for about a week.  She has noted some white discharge that looks like cottage cheese.  No dysuria and no abdominal pain.  No fever or vomiting.  She has not had a period since she had her baby.  She delivered on January 4 of this year.  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   Miscarriage 05/16/2019    Past Surgical History:  Procedure Laterality Date   DILATION AND EVACUATION N/A 06/06/2019   Procedure: DILATATION AND EVACUATION;  Surgeon: Allyn Kenner, DO;  Location: [facility];  Service: Gynecology;  Laterality: N/A;   HYSTEROSCOPY WITH D & C N/A 06/18/2020   Procedure: DILATATION AND EVACUATION;  Surgeon: Allyn Kenner, DO;  Location: [place];  Service: Gynecology;  Laterality: N/A;   KNEE SURGERY Right 2007   mass removed from under patella (per pt benign)   WISDOM TOOTH EXTRACTION  2011    OB History     Gravida  4   Para  1   Term  1   Preterm      AB  3   Living  1      SAB  1   IAB  2   Ectopic      Multiple  0   Live Births  1        Obstetric Comments  Last pap: 10/26/16 Hx of abnormal pap: yes, in 2017, repeat normal History of STIs: denies Age at menarche: 60          Home Medications    Prior to Admission  medications   Medication Sig Start Date End Date Taking? Authorizing Provider  fluconazole (DIFLUCAN) 150 MG tablet Take 1 tablet (150 mg total) by mouth every 3 (three) days for 2 doses. 12/22/22 12/26/22 Yes Barrett Henle, MD  levothyroxine (SYNTHROID) 50 MCG tablet Take 50 mcg by mouth daily before breakfast. 02/23/22   [provider]  MAGNESIUM OXIDE PO Take 1 tablet by mouth at bedtime.    [provider]  Prenatal Vit-Fe Fumarate-FA (PRENATAL PO) Take 1 tablet by mouth at bedtime.    [provider]    Family History Family History  Problem Relation Age of Onset   Lupus Maternal Uncle    Uterine cancer Paternal Grandmother     Social History Social History   Tobacco Use   Smoking status: Never   Smokeless tobacco: Never  Vaping Use   Vaping Use: Never used  Substance Use Topics   Alcohol use: Yes   Drug use: Never     Allergies   Patient has no known allergies.   Review of Systems Review of Systems   Physical Exam  Triage Vital Signs ED Triage Vitals  Enc Vitals Group     BP 12/22/22 1311 116/72     Pulse Rate 12/22/22 1311 81     Resp 12/22/22 1311 18     Temp 12/22/22 1311 98 F (36.7 C)     Temp Source 12/22/22 1311 Oral     SpO2 12/22/22 1311 98 %     Weight --      Height --      Head Circumference --      Peak Flow --      Pain Score 12/22/22 1313 0     Pain Loc --      Pain Edu? --      Excl. in [place]? --    No data found.  Updated Vital Signs BP 116/72 (BP Location: Right Arm)   Pulse 81   Temp 98 F (36.7 C) (Oral)   Resp 18   SpO2 98%   Breastfeeding No   Visual Acuity Right Eye Distance:   Left Eye Distance:   Bilateral Distance:    Right Eye Near:   Left Eye Near:    Bilateral Near:     Physical Exam Vitals reviewed.  Constitutional:      General: She is not in acute distress.    Appearance: She is not ill-appearing, toxic-appearing or diaphoretic.  HENT:     Mouth/Throat:     Mouth: Mucous  membranes are moist.  Cardiovascular:     Rate and Rhythm: Normal rate and regular rhythm.  Pulmonary:     Effort: Pulmonary effort is normal.     Breath sounds: Normal breath sounds.  Abdominal:     Palpations: Abdomen is soft.     Tenderness: There is no abdominal tenderness.  Skin:    Coloration: Skin is not pale.  Neurological:     Mental Status: She is alert and oriented to person, place, and time.  Psychiatric:        Behavior: Behavior normal.      UC Treatments / Results  Labs (all labs ordered are listed, but only abnormal results are displayed) Labs Reviewed  POCT URINE PREGNANCY  CERVICOVAGINAL ANCILLARY ONLY    EKG   Radiology No results found.  Procedures Procedures (including critical care time)  Medications Ordered in UC Medications - No data to display  Initial Impression / Assessment and Plan / UC Course  I have reviewed the triage vital signs and the nursing notes.  Pertinent labs & imaging results that were available during my care of the patient were reviewed by me and considered in my medical decision making (see chart for details).        UPT is negative. Fluconazole is sent empirically to treat possible yeast vaginitis.   Vaginal swab is done and staff will notify her of any positives that need treatment.  Final diagnoses:  Acute vaginitis     Discharge Instructions      The pregnancy test was negative.  Take fluconazole 150 mg--1 tablet every 3 days for 2 doses  Staff will notify you of any positive tests on the swab     ED Prescriptions     Medication Sig Dispense Auth. Provider   fluconazole (DIFLUCAN) 150 MG tablet Take 1 tablet (150 mg total) by mouth every 3 (three) days for 2 doses. 2 tablet Windy Carina Gwenlyn Perking, MD      PDMP not reviewed this encounter.   Barrett Henle, MD 12/22/22 [phone]  Barrett Henle, MD 12/22/22 [phone]

## 2022-12-22 NOTE — ED Triage Notes (Signed)
Pt presents with vaginal itching X 1 week.

## 2022-12-23 LAB — CERVICOVAGINAL ANCILLARY ONLY
Bacterial Vaginitis (gardnerella): POSITIVE — AB
Candida Glabrata: NEGATIVE
Candida Vaginitis: POSITIVE — AB
Chlamydia: NEGATIVE
Comment: NEGATIVE
Comment: NEGATIVE
Comment: NEGATIVE
Comment: NEGATIVE
Comment: NEGATIVE
Comment: NORMAL
Neisseria Gonorrhea: NEGATIVE
Trichomonas: NEGATIVE

## 2022-12-25 ENCOUNTER — Telehealth: Payer: Self-pay

## 2022-12-25 MED ORDER — METRONIDAZOLE 500 MG PO TABS: 500.0000 mg | ORAL_TABLET | Freq: Two times a day (BID) | ORAL | 0 refills | Status: DC

## 2023-02-02 ENCOUNTER — Ambulatory Visit: Payer: Medicaid Other | Admitting: Family Medicine

## 2023-02-02 ENCOUNTER — Encounter: Payer: Self-pay | Admitting: Family Medicine

## 2023-02-02 VITALS — BP 102/71 | HR 67 | Resp 18 | Ht 64.17 in | Wt 175.0 lb

## 2023-02-02 DIAGNOSIS — D229 Melanocytic nevi, unspecified: Secondary | ICD-10-CM | POA: Diagnosis not present

## 2023-02-02 DIAGNOSIS — K640 First degree hemorrhoids: Secondary | ICD-10-CM

## 2023-02-02 DIAGNOSIS — E039 Hypothyroidism, unspecified: Secondary | ICD-10-CM | POA: Diagnosis not present

## 2023-02-02 HISTORY — DX: First degree hemorrhoids: K64.0

## 2023-02-02 HISTORY — DX: Melanocytic nevi, unspecified: D22.9

## 2023-02-02 MED ORDER — HYDROCORTISONE ACETATE 25 MG RE SUPP
25.0000 mg | Freq: Two times a day (BID) | RECTAL | 0 refills | Status: AC
Start: 2023-02-02 — End: 2023-02-16

## 2023-02-02 NOTE — Patient Instructions (Addendum)
I have sent in a prescription for you for hydrocortisone suppositories.  This should be helpful in helping to shrink the hemorrhoids.  Continue to drink lots of water and take stool softeners to ensure that you are not having to strain.  Avoid lifting anything heavier than about 20 pounds.  Sitz baths can be helpful for any pain as well, you can google it to find good instructions on how to do so.  If your symptoms have not improved after about 2 weeks, send me a message and I can get you a referral to see general surgery for evaluation.  Otherwise, I will see you for your annual physical and we will recheck your thyroid at that time.  It was nice to meet you today!

## 2023-02-02 NOTE — Progress Notes (Signed)
New Patient Office Visit  Subjective    Patient ID: Betty Chung, female    DOB: 04/08/1991  Age: 32 y.o. MRN: [id number]  CC:  Chief Complaint  Patient presents with   Establish Care   Constipation   Nevus    Neck & Back    HPI Betty BiddingDanielle Dedeaux presents to establish care. Past medical history significant for vaginal delivery 11/25/2022, antiphospholipid syndrome, and hypothyroidism which developed during her pregnancy.  She is currently taking levothyroxine 25 mcg daily before breakfast as prescribed by OB/GYN, but she would like to transfer management to primary care.  She has not had a primary care provider before.  She is up-to-date on vaccinations though she opted out of all COVID vaccines.  She is also up-to-date on Pap smear, we will need to request a copy from OB/GYN. She has been experiencing rectal pain and bleeding with defecation ever since her delivery.  Her stools are soft, though she has been taking 2 different stool softeners to ensure that they remain soft.  Even with soft to loose stools, she still experiences pain.  There has been blood on the toilet paper.  Outpatient Encounter Medications as of 02/02/2023  Medication Sig   hydrocortisone (ANUSOL-HC) 25 MG suppository Place 1 suppository (25 mg total) rectally 2 (two) times daily for 14 days.   levothyroxine (SYNTHROID) 50 MCG tablet Take 50 mcg by mouth daily before breakfast.   MAGNESIUM OXIDE PO Take 1 tablet by mouth at bedtime.   meloxicam (MOBIC) 15 MG tablet Take 15 mg by mouth daily.   Prenatal Vit-Fe Fumarate-FA (PRENATAL PO) Take 1 tablet by mouth at bedtime.   [DISCONTINUED] metroNIDAZOLE (FLAGYL) 500 MG tablet Take 1 tablet (500 mg total) by mouth 2 (two) times daily.   No facility-administered encounter medications on file as of 02/02/2023.    Past Medical History:  Diagnosis Date   COVID-19 09/2020   History of molar pregnancy, antepartum 05/2019   Missed ab    Wears glasses     Past  Surgical History:  Procedure Laterality Date   DILATION AND EVACUATION N/A 06/06/2019   Procedure: DILATATION AND EVACUATION;  Surgeon: Philip Aspenallahan, Sidney, DO;  [facility] [place];  Service: Gynecology;  Laterality: N/A;   HYSTEROSCOPY WITH D & C N/A 06/18/2020   Procedure: DILATATION AND EVACUATION;  Surgeon: Philip Aspenallahan, Sidney, DO;  [facility] [place];  Service: Gynecology;  Laterality: N/A;   KNEE SURGERY Right 2007   mass removed from under patella (per pt benign)   WISDOM TOOTH EXTRACTION  2011    Family History  Problem Relation Age of Onset   Lupus Maternal Uncle    Uterine cancer Paternal Grandmother     Social History   Socioeconomic History   Marital status: Single    Spouse name: Not on file   Number of children: Not on file   Years of education: Not on file   Highest education level: Not on file  Occupational History   Not on file  Tobacco Use   Smoking status: Never    Passive exposure: Never   Smokeless tobacco: Never  Vaping Use   Vaping Use: Never used  Substance and Sexual Activity   Alcohol use: Yes   Drug use: Never   Sexual activity: Yes    Birth control/protection: None  Other Topics Concern   Not on file  Social History Narrative   Not on file   Social Determinants of Health   Financial  Resource Strain: Low Risk  (02/02/2023)   Overall Financial Resource Strain (CARDIA)    Difficulty of Paying Living Expenses: Not hard at all  Food Insecurity: No Food Insecurity (10/28/2022)   Hunger Vital Sign    Worried About Running Out of Food in the Last Year: Never true    Ran Out of Food in the Last Year: Never true  Transportation Needs: No Transportation Needs (10/28/2022)   PRAPARE - Administrator, Civil Service (Medical): No    Lack of Transportation (Non-Medical): No  Physical Activity: Not on file  Stress: No Stress Concern Present (02/02/2023)   [employer] of Occupational Health - Occupational Stress  Questionnaire    Feeling of Stress : Only a little  Social Connections: Not on file  Intimate Partner Violence: Not At Risk (10/28/2022)   Humiliation, Afraid, Rape, and Kick questionnaire    Fear of Current or Ex-Partner: No    Emotionally Abused: No    Physically Abused: No    Sexually Abused: No    Review of Systems  Constitutional:  Negative for chills, fever and malaise/fatigue.  HENT:  Negative for congestion and hearing loss.   Eyes:  Negative for blurred vision and double vision.  Respiratory:  Negative for cough and shortness of breath.   Cardiovascular:  Negative for chest pain, palpitations and leg swelling.  Gastrointestinal:  Positive for blood in stool. Negative for abdominal pain, constipation, diarrhea and heartburn.       Pain with defecation  Genitourinary:  Negative for frequency and urgency.  Musculoskeletal:  Negative for myalgias and neck pain.  Neurological:  Negative for headaches.  Endo/Heme/Allergies:  Negative for polydipsia.  Psychiatric/Behavioral:  Negative for depression. The patient is not nervous/anxious.     Objective    BP 102/71 (BP Location: Left Arm, Patient Position: Sitting, Cuff Size: Large)   Pulse 67   Resp 18   Ht 5' 4.17" (1.63 m)   Wt 175 lb (79.4 kg)   LMP 01/29/2023 (Exact Date)   SpO2 98%   Breastfeeding No   BMI 29.88 kg/m   Physical Exam Exam conducted with a chaperone present.  Constitutional:      General: She is not in acute distress.    Appearance: Normal appearance.  HENT:     Head: Normocephalic and atraumatic.  Cardiovascular:     Rate and Rhythm: Normal rate and regular rhythm.     Heart sounds: Normal heart sounds. No murmur heard.    No friction rub. No gallop.  Pulmonary:     Effort: Pulmonary effort is normal. No respiratory distress.     Breath sounds: No wheezing, rhonchi or rales.  Genitourinary:    Rectum: External hemorrhoid present. No tenderness or anal fissure. Normal anal tone.     Comments:  Chaperone present for rectal exam. Skin:    General: Skin is warm and dry.  Neurological:     Mental Status: She is alert and oriented to person, place, and time.     Assessment & Plan:  Multiple nevi Assessment & Plan: Patient has family history of skin cancer and precancers.  She also has fair skin, recommended annual skin check in addition to having the nevi that she is concerned about examined.  Orders: -     Ambulatory referral to Dermatology  Grade I hemorrhoids Assessment & Plan: Patient presentation and DRE positive for external hemorrhoids.  Recommended to continue with stool softeners as well as adequate fiber and water intake.  Prescribed hydrocortisone suppositories and recommended sitz bath's for symptomatic relief.  If not resolved after course of suppositories, we discussed that it may be necessary to see general surgery for and in the office or OR procedure.  Patient verbalized understanding and is agreeable to this plan.  Orders: -     Hydrocortisone Acetate; Place 1 suppository (25 mg total) rectally 2 (two) times daily for 14 days.  Dispense: 28 suppository; Refill: 0  Acquired hypothyroidism Assessment & Plan: Continue to monitor.  Continue Synthroid 25 mcg for now.  We will recheck thyroid levels in a few months to see if they can be discontinued altogether.     Return in about 6 months (around 08/04/2023) for annual physical, fasting blood work 1 week before.   I spent 45 minutes on the day of the encounter to include pre-visit record review, face-to-face time with the patient and post visit ordering of test.  Melida Quitter, PA

## 2023-02-02 NOTE — Assessment & Plan Note (Signed)
Patient has family history of skin cancer and precancers.  She also has fair skin, recommended annual skin check in addition to having the nevi that she is concerned about examined.

## 2023-02-02 NOTE — Assessment & Plan Note (Signed)
Patient presentation and DRE positive for external hemorrhoids.  Recommended to continue with stool softeners as well as adequate fiber and water intake.  Prescribed hydrocortisone suppositories and recommended sitz bath's for symptomatic relief.  If not resolved after course of suppositories, we discussed that it may be necessary to see general surgery for and in the office or OR procedure.  Patient verbalized understanding and is agreeable to this plan.

## 2023-02-02 NOTE — Assessment & Plan Note (Signed)
Continue to monitor.  Continue Synthroid 25 mcg for now.  We will recheck thyroid levels in a few months to see if they can be discontinued altogether.

## 2023-02-03 ENCOUNTER — Encounter: Payer: Self-pay | Admitting: Family Medicine

## 2023-03-18 ENCOUNTER — Telehealth: Payer: Self-pay

## 2023-03-18 DIAGNOSIS — K640 First degree hemorrhoids: Secondary | ICD-10-CM

## 2023-03-18 NOTE — Telephone Encounter (Signed)
Called patient and informed of referral, patient is scheduled for tomorrow 5/24 with Dr. Constance Goltz, thanks.

## 2023-03-18 NOTE — Telephone Encounter (Signed)
Referral has been placed to general surgery.  She should be getting a call from them to schedule in the next week or two!

## 2023-03-18 NOTE — Telephone Encounter (Signed)
Patient called office concerning her hemorrhoids, patient states that it has not gotten better and she wanted to know if she needs to follow up with Lequita Halt or if she could get referral, please advise, thanks.

## 2023-03-19 ENCOUNTER — Ambulatory Visit: Payer: BLUE CROSS/BLUE SHIELD | Admitting: Family Medicine

## 2023-03-19 ENCOUNTER — Encounter: Payer: Self-pay | Admitting: Family Medicine

## 2023-03-19 VITALS — BP 115/77 | HR 69 | Temp 98.8°F | Ht 64.17 in | Wt 174.8 lb

## 2023-03-19 DIAGNOSIS — D649 Anemia, unspecified: Secondary | ICD-10-CM | POA: Diagnosis not present

## 2023-03-19 DIAGNOSIS — K640 First degree hemorrhoids: Secondary | ICD-10-CM

## 2023-03-19 MED ORDER — LIDOCAINE 5 % EX OINT
1.0000 | TOPICAL_OINTMENT | CUTANEOUS | 0 refills | Status: DC | PRN
Start: 2023-03-19 — End: 2024-02-16

## 2023-03-19 NOTE — Assessment & Plan Note (Signed)
External hemorrhoids, possibly internal as well.  Possibly has anal fissures given her complaint of sharp pain.  Advised her to take Metamucil.  Referral to general surgery already sent.  Prescribed lidocaine ointment for symptomatic relief.  Advised her goal is bowel movement a day.  Advised to increase water intake.  Will get CBC.  Patient's last CBC in January near the time her pregnancy showed anemia.  She will have to come back to get this lab drawn.

## 2023-03-19 NOTE — Patient Instructions (Signed)
It was nice to meet you today,  For your hemorrhoids I have prescribed a lidocaine ointment.  This is a numbing medication that you can apply when you have pain.  The other thing you should do is take Metamucil fiber 3 times a day.  Work your way up to it by taking 1 dose the first day then 2 times second day and then 3 times after that with breakfast lunch and dinner.  Other stool softener options include over-the-counter Dulcolax.  Colace is not very effective so you do not need to keep using that.  Your goal should be 1 bowel movement a day.  You will need to come back at some point in the next few weeks in the morning for a lab visit to recheck your hemoglobin.  You can come anytime for 1130, just call ahead to schedule a time.  Have a great day,  Frederic Jericho, MD

## 2023-03-19 NOTE — Progress Notes (Signed)
   Established Patient Office Visit  Subjective   Patient ID: Betty Chung, female    DOB: 1991/06/19  Age: 32 y.o. MRN: [id number]  Chief Complaint  Patient presents with   Hemorrhoids    HPI  Hemorrhoids -patient states that she finished taking the suppositories and hemorrhoid felt better for a few days but then they returned.  Pain is mostly an hour before she needs to go have a bowel movement and worse when she has a bowel movement.  Otherwise no pain.  No pain with sitting.  When she has bowel and feels like knives are coming out.  Has been taking Colace but no other stool softeners.  Tried taking MiraLAX but did not feel like that was helping.  Has 1 hard bowel movement every other day.  Some bright red blood in the stool.  No black tarry stools.    ROS    Objective:     BP 115/77   Pulse 69   Temp 98.8 F (37.1 C) (Oral)   Ht 5' 4.17" (1.63 m)   Wt 174 lb 12 oz (79.3 kg)   LMP 02/23/2023   SpO2 100%   BMI 29.84 kg/m    Physical Exam General: Alert, oriented Pulmonary: No respiratory distress Psych: Pleasant affect  No results found for any visits on 03/19/23.    The ASCVD Risk score (Arnett DK, et al., 2019) failed to calculate for the following reasons:   The 2019 ASCVD risk score is only valid for ages 4 to 15    Assessment & Plan:   Problem List Items Addressed This Visit       Cardiovascular and Mediastinum   Grade I hemorrhoids    External hemorrhoids, possibly internal as well.  Possibly has anal fissures given her complaint of sharp pain.  Advised her to take Metamucil.  Referral to general surgery already sent.  Prescribed lidocaine ointment for symptomatic relief.  Advised her goal is bowel movement a day.  Advised to increase water intake.  Will get CBC.  Patient's last CBC in January near the time her pregnancy showed anemia.  She will have to come back to get this lab drawn.      Other Visit Diagnoses     Anemia, unspecified type     -  Primary   Relevant Orders   CBC       Return if symptoms worsen or fail to improve.    Sandre Kitty, MD

## 2023-03-26 ENCOUNTER — Ambulatory Visit: Payer: Medicaid Other | Admitting: Family Medicine

## 2023-03-26 ENCOUNTER — Encounter: Payer: Self-pay | Admitting: Family Medicine

## 2023-03-26 VITALS — BP 100/68 | HR 68 | Resp 20 | Ht 64.17 in | Wt 173.0 lb

## 2023-03-26 DIAGNOSIS — F53 Postpartum depression: Secondary | ICD-10-CM | POA: Diagnosis not present

## 2023-03-26 DIAGNOSIS — D649 Anemia, unspecified: Secondary | ICD-10-CM

## 2023-03-26 DIAGNOSIS — F411 Generalized anxiety disorder: Secondary | ICD-10-CM | POA: Diagnosis not present

## 2023-03-26 HISTORY — DX: Anemia, unspecified: D64.9

## 2023-03-26 MED ORDER — ESCITALOPRAM OXALATE 10 MG PO TABS
10.0000 mg | ORAL_TABLET | Freq: Every day | ORAL | 1 refills | Status: DC
Start: 2023-03-26 — End: 2023-04-30

## 2023-03-26 NOTE — Assessment & Plan Note (Signed)
PHQ-9 score of 6.  Starting Lexapro 10 mg daily.  Will continue to monitor.

## 2023-03-26 NOTE — Assessment & Plan Note (Addendum)
GAD-7 score 19, making life very difficult.  Starting Lexapro 10 mg daily.  We discussed mechanism of action and potential side effects.  Patient has tolerated Lexapro well in the past other than mild nausea that eventually went away.  Patient verbalized understanding, agreeable to restarting Lexapro.  Will follow-up in 4-6 weeks and adjust medication regimen as needed.

## 2023-03-26 NOTE — Progress Notes (Signed)
Acute Office Visit  Subjective:     Patient ID: Betty Chung, female    DOB: [birth year], 32 y.o.   MRN: [id number]  Chief Complaint  Patient presents with   Anxiety    HPI Patient is in today for anxiety.  She has experienced increased levels of anxiety, similar to anxiety that she had several years ago.  In the past, when she did find herself worrying almost every day and experiencing irritability/insomnia, she took Lexapro which stabilized her mood.  She is interested in starting this again.  She believes that the postpartum.  Has contributed to her changes in mood recently.     03/26/2023    8:38 AM 03/19/2023   11:16 AM 02/02/2023    3:41 PM  Depression screen PHQ 2/9  Decreased Interest 1 1 0  Down, Depressed, Hopeless 1 0 0  PHQ - 2 Score 2 1 0  Altered sleeping 0 0 0  Tired, decreased energy 2 2 1   Change in appetite 0 0 0  Feeling bad or failure about yourself  2 1 0  Trouble concentrating 0 0 0  Moving slowly or fidgety/restless 0 0 0  Suicidal thoughts 0 0 0  PHQ-9 Score 6 4 1   Difficult doing work/chores Somewhat difficult Somewhat difficult Somewhat difficult      03/26/2023    8:39 AM 03/19/2023   11:17 AM 02/02/2023    4:32 PM  GAD 7 : Generalized Anxiety Score  Nervous, Anxious, on Edge 3 3 0  Control/stop worrying 3 3 0  Worry too much - different things 3 3 0  Trouble relaxing 3 3 0  Restless 1 0 0  Easily annoyed or irritable 3 3 1   Afraid - awful might happen 3 1 1   Total GAD 7 Score 19 16 2   Anxiety Difficulty Very difficult Very difficult Not difficult at all   ROS Negative unless otherwise noted in HPI    Objective:    BP 100/68 (BP Location: Right Arm, Patient Position: Sitting, Cuff Size: Large)   Pulse 68   Resp 20   Ht 5' 4.17" (1.63 m)   Wt 173 lb (78.5 kg)   LMP 02/23/2023   SpO2 97%   BMI 29.54 kg/m   Physical Exam Constitutional:      General: She is not in acute distress.    Appearance: Normal appearance.  HENT:      Head: Normocephalic and atraumatic.  Cardiovascular:     Rate and Rhythm: Normal rate and regular rhythm.     Heart sounds: No murmur heard.    No friction rub. No gallop.  Pulmonary:     Effort: Pulmonary effort is normal. No respiratory distress.     Breath sounds: No wheezing, rhonchi or rales.  Musculoskeletal:     Cervical back: Normal range of motion.  Skin:    General: Skin is warm and dry.  Neurological:     General: No focal deficit present.     Mental Status: She is alert and oriented to person, place, and time. Mental status is at baseline.  Psychiatric:        Mood and Affect: Mood normal.        Thought Content: Thought content normal.        Judgment: Judgment normal.      Assessment & Plan:  Anemia, unspecified type Assessment & Plan: Previous CBCs revealed normocytic anemia.  Repeating CBC today as per Dr. Lonell Face recommendation.  Orders: -  CBC with Differential/Platelet; Future  GAD (generalized anxiety disorder) Assessment & Plan: GAD-7 score 19, making life very difficult.  Starting Lexapro 10 mg daily.  We discussed mechanism of action and potential side effects.  Patient has tolerated Lexapro well in the past other than mild nausea that eventually went away.  Patient verbalized understanding, agreeable to restarting Lexapro.  Will follow-up in 4-6 weeks and adjust medication regimen as needed.  Orders: -     Escitalopram Oxalate; Take 1 tablet (10 mg total) by mouth daily.  Dispense: 60 tablet; Refill: 1  Postpartum depression Assessment & Plan: PHQ-9 score of 6.  Starting Lexapro 10 mg daily.  Will continue to monitor.  Orders: -     Escitalopram Oxalate; Take 1 tablet (10 mg total) by mouth daily.  Dispense: 60 tablet; Refill: 1    Return in about 4 weeks (around 04/23/2023) for follow-up for mood after starting Lexapro, blood work (not fasting) 1 week before.  Melida Quitter, PA

## 2023-03-26 NOTE — Assessment & Plan Note (Signed)
Previous CBCs revealed normocytic anemia.  Repeating CBC today as per Dr. Lonell Face recommendation.

## 2023-03-27 LAB — CBC WITH DIFFERENTIAL/PLATELET
Basophils Absolute: 0 10*3/uL (ref 0.0–0.2)
Basos: 1 %
EOS (ABSOLUTE): 0.1 10*3/uL (ref 0.0–0.4)
Eos: 3 %
Hematocrit: 36.1 % (ref 34.0–46.6)
Hemoglobin: 10.4 g/dL — ABNORMAL LOW (ref 11.1–15.9)
Immature Grans (Abs): 0 10*3/uL (ref 0.0–0.1)
Immature Granulocytes: 0 %
Lymphocytes Absolute: 1.9 10*3/uL (ref 0.7–3.1)
Lymphs: 36 %
MCH: 22 pg — ABNORMAL LOW (ref 26.6–33.0)
MCHC: 28.8 g/dL — ABNORMAL LOW (ref 31.5–35.7)
MCV: 77 fL — ABNORMAL LOW (ref 79–97)
Monocytes Absolute: 0.5 10*3/uL (ref 0.1–0.9)
Monocytes: 9 %
Neutrophils Absolute: 2.7 10*3/uL (ref 1.4–7.0)
Neutrophils: 51 %
Platelets: 303 10*3/uL (ref 150–450)
RBC: 4.72 x10E6/uL (ref 3.77–5.28)
RDW: 17.2 % — ABNORMAL HIGH (ref 11.7–15.4)
WBC: 5.2 10*3/uL (ref 3.4–10.8)

## 2023-04-15 ENCOUNTER — Other Ambulatory Visit: Payer: Self-pay | Admitting: Family Medicine

## 2023-04-15 DIAGNOSIS — Z0000 Encounter for general adult medical examination without abnormal findings: Secondary | ICD-10-CM

## 2023-04-15 DIAGNOSIS — D649 Anemia, unspecified: Secondary | ICD-10-CM

## 2023-04-23 ENCOUNTER — Other Ambulatory Visit: Payer: Medicaid Other

## 2023-04-23 DIAGNOSIS — D649 Anemia, unspecified: Secondary | ICD-10-CM

## 2023-04-23 DIAGNOSIS — Z0000 Encounter for general adult medical examination without abnormal findings: Secondary | ICD-10-CM

## 2023-04-24 LAB — COMPREHENSIVE METABOLIC PANEL
ALT: 15 IU/L (ref 0–32)
AST: 17 IU/L (ref 0–40)
Albumin: 4.7 g/dL (ref 3.9–4.9)
Alkaline Phosphatase: 83 IU/L (ref 44–121)
BUN/Creatinine Ratio: 20 (ref 9–23)
BUN: 15 mg/dL (ref 6–20)
Bilirubin Total: 0.4 mg/dL (ref 0.0–1.2)
CO2: 24 mmol/L (ref 20–29)
Calcium: 9.6 mg/dL (ref 8.7–10.2)
Chloride: 101 mmol/L (ref 96–106)
Creatinine, Ser: 0.76 mg/dL (ref 0.57–1.00)
Globulin, Total: 2.9 g/dL (ref 1.5–4.5)
Glucose: 86 mg/dL (ref 70–99)
Potassium: 4.5 mmol/L (ref 3.5–5.2)
Sodium: 138 mmol/L (ref 134–144)
Total Protein: 7.6 g/dL (ref 6.0–8.5)
eGFR: 107 mL/min/{1.73_m2} (ref >59)

## 2023-04-24 LAB — CBC WITH DIFFERENTIAL/PLATELET
Basophils Absolute: 0 10*3/uL (ref 0.0–0.2)
Basos: 1 %
EOS (ABSOLUTE): 0.1 10*3/uL (ref 0.0–0.4)
Eos: 2 %
Hematocrit: 38.7 % (ref 34.0–46.6)
Hemoglobin: 11.1 g/dL (ref 11.1–15.9)
Immature Grans (Abs): 0 10*3/uL (ref 0.0–0.1)
Immature Granulocytes: 0 %
Lymphocytes Absolute: 1.6 10*3/uL (ref 0.7–3.1)
Lymphs: 32 %
MCH: 22.5 pg — ABNORMAL LOW (ref 26.6–33.0)
MCHC: 28.7 g/dL — ABNORMAL LOW (ref 31.5–35.7)
MCV: 79 fL (ref 79–97)
Monocytes Absolute: 0.4 10*3/uL (ref 0.1–0.9)
Monocytes: 9 %
Neutrophils Absolute: 2.9 10*3/uL (ref 1.4–7.0)
Neutrophils: 56 %
Platelets: 264 10*3/uL (ref 150–450)
RBC: 4.93 x10E6/uL (ref 3.77–5.28)
RDW: 17.4 % — ABNORMAL HIGH (ref 11.7–15.4)
WBC: 5.1 10*3/uL (ref 3.4–10.8)

## 2023-04-26 ENCOUNTER — Encounter: Payer: Self-pay | Admitting: Family Medicine

## 2023-04-26 DIAGNOSIS — E038 Other specified hypothyroidism: Secondary | ICD-10-CM

## 2023-04-26 MED ORDER — LEVOTHYROXINE SODIUM 25 MCG PO TABS
25.0000 ug | ORAL_TABLET | Freq: Every day | ORAL | 2 refills | Status: AC
Start: 2023-04-26 — End: ?

## 2023-04-30 ENCOUNTER — Ambulatory Visit: Payer: Medicaid Other | Admitting: Family Medicine

## 2023-04-30 ENCOUNTER — Encounter: Payer: Self-pay | Admitting: Family Medicine

## 2023-04-30 VITALS — BP 109/75 | HR 85 | Resp 18 | Ht 64.17 in | Wt 169.0 lb

## 2023-04-30 DIAGNOSIS — F53 Postpartum depression: Secondary | ICD-10-CM

## 2023-04-30 DIAGNOSIS — F411 Generalized anxiety disorder: Secondary | ICD-10-CM | POA: Diagnosis not present

## 2023-04-30 MED ORDER — ESCITALOPRAM OXALATE 10 MG PO TABS
10.0000 mg | ORAL_TABLET | Freq: Every day | ORAL | 3 refills | Status: DC
Start: 2023-04-30 — End: 2024-08-09

## 2023-04-30 NOTE — Progress Notes (Signed)
Established Patient Office Visit  Subjective   Patient ID: Betty Chung, female    DOB: 1991-02-20  Age: 32 y.o. MRN: [id number]  Chief Complaint  Patient presents with   Anxiety   Depression    HPI Breighlyn Chung is a 32 y.o. female presenting today for follow up of mood. Mood: Patient is here to follow up for anxiety and depression, currently managing with Lexapro 10 mg daily which was started at her last visit.  The first few days of taking it she noticed an increase in her anxiety, but this eventually subsided.  Taking medication without side effects now, reports excellent compliance with treatment. Denies mood changes or SI/HI. She feels mood is improved since last visit. Denies chest pain, difficulty concentrating, dizziness, fatigue, insomnia, irritability, palpitations, panic attacks, racing thoughts, SOB, sweating. Denies anhedonia, depressed mood, difficulty concentrating, fatigue, feelings of worthlessness/guilt, hopelessness, hypersomnia, impaired memory, insomnia, psychomotor agitation, psychomotor retardation, recurrent thoughts of death, weight changes.     May 06, 2023    9:36 AM 03/26/2023    8:38 AM 03/19/2023   11:16 AM  Depression screen PHQ 2/9  Decreased Interest 0 1 1  Down, Depressed, Hopeless 0 1 0  PHQ - 2 Score 0 2 1  Altered sleeping 1 0 0  Tired, decreased energy 0 2 2  Change in appetite 0 0 0  Feeling bad or failure about yourself  0 2 1  Trouble concentrating 0 0 0  Moving slowly or fidgety/restless 0 0 0  Suicidal thoughts 0 0 0  PHQ-9 Score 1 6 4   Difficult doing work/chores Somewhat difficult Somewhat difficult Somewhat difficult       2023/05/06    9:36 AM 03/26/2023    8:39 AM 03/19/2023   11:17 AM 02/02/2023    4:32 PM  GAD 7 : Generalized Anxiety Score  Nervous, Anxious, on Edge 1 3 3  0  Control/stop worrying 0 3 3 0  Worry too much - different things 0 3 3 0  Trouble relaxing 1 3 3  0  Restless 0 1 0 0  Easily annoyed or irritable  1 3 3 1   Afraid - awful might happen 1 3 1 1   Total GAD 7 Score 4 19 16 2   Anxiety Difficulty Somewhat difficult Very difficult Very difficult Not difficult at all     ROS Negative unless otherwise noted in HPI   Objective:     BP 109/75 (BP Location: Left Arm, Patient Position: Sitting, Cuff Size: Normal)   Pulse 85   Resp 18   Ht 5' 4.17" (1.63 m)   Wt 169 lb (76.7 kg)   SpO2 99%   Breastfeeding Unknown   BMI 28.86 kg/m   Physical Exam Constitutional:      General: She is not in acute distress.    Appearance: Normal appearance.  HENT:     Head: Normocephalic and atraumatic.  Cardiovascular:     Rate and Rhythm: Normal rate and regular rhythm.     Heart sounds: No murmur heard.    No friction rub. No gallop.  Pulmonary:     Effort: Pulmonary effort is normal. No respiratory distress.     Breath sounds: No wheezing, rhonchi or rales.  Skin:    General: Skin is warm and dry.  Neurological:     Mental Status: She is alert and oriented to person, place, and time.     Assessment & Plan:  GAD (generalized anxiety disorder) [F41.1] Assessment & Plan: GAD-7 score improved  from 19 to 4.  Patient is very happy with where her mood is at now.  Continue Lexapro 10 mg daily.  Upcoming physical in October, after that continue with follow-up every 6 months.  6 month follow-up can be a video visit.  Orders: -     Escitalopram Oxalate; Take 1 tablet (10 mg total) by mouth daily.  Dispense: 90 tablet; Refill: 3  Postpartum depression Assessment & Plan: PHQ-9 score improved from 6 to 1.  Continue Lexapro 10 mg daily.  Will continue to monitor.  Orders: -     Escitalopram Oxalate; Take 1 tablet (10 mg total) by mouth daily.  Dispense: 90 tablet; Refill: 3    Return in about 3 months (around 08/05/2023) for annual physical, fasting blood work 1 week before (already scheduled).    Betty Quitter, PA

## 2023-04-30 NOTE — Assessment & Plan Note (Signed)
PHQ-9 score improved from 6 to 1.  Continue Lexapro 10 mg daily.  Will continue to monitor.

## 2023-04-30 NOTE — Patient Instructions (Signed)
I am so glad we found a good fit for you that is helpful. I will see you again in October for your annual physical. Until then, enjoy the rest of the summer!   One more thing I forgot to mention: Make sure to be drinking lots of water, taking a medicine like Lexapro can make it easier to get dehydrated if you are out in the heat.

## 2023-04-30 NOTE — Assessment & Plan Note (Addendum)
GAD-7 score improved from 19 to 4.  Patient is very happy with where her mood is at now.  Continue Lexapro 10 mg daily.  Upcoming physical in October, after that continue with follow-up every 6 months.  6 month follow-up can be a video visit.

## 2023-06-14 ENCOUNTER — Other Ambulatory Visit: Payer: Self-pay

## 2023-06-14 ENCOUNTER — Encounter (HOSPITAL_BASED_OUTPATIENT_CLINIC_OR_DEPARTMENT_OTHER): Payer: Self-pay | Admitting: General Surgery

## 2023-06-17 ENCOUNTER — Ambulatory Visit: Payer: Self-pay | Admitting: General Surgery

## 2023-06-18 NOTE — Progress Notes (Signed)
Patient notified that she has a pre-surgical drink and soap to pick up today (06/18/23) from [facility].

## 2023-06-21 ENCOUNTER — Ambulatory Visit (HOSPITAL_BASED_OUTPATIENT_CLINIC_OR_DEPARTMENT_OTHER)
Admission: RE | Admit: 2023-06-21 | Discharge: 2023-06-21 | Disposition: A | Discharge status: Home / Self Care | Payer: Medicaid Other | Attending: General Surgery | Admitting: General Surgery

## 2023-06-21 ENCOUNTER — Ambulatory Visit (HOSPITAL_BASED_OUTPATIENT_CLINIC_OR_DEPARTMENT_OTHER): Payer: Medicaid Other | Admitting: Certified Registered"

## 2023-06-21 ENCOUNTER — Other Ambulatory Visit: Payer: Self-pay

## 2023-06-21 ENCOUNTER — Encounter (HOSPITAL_BASED_OUTPATIENT_CLINIC_OR_DEPARTMENT_OTHER)
Admission: RE | Disposition: A | Discharge status: Home / Self Care | Payer: Self-pay | Source: Home / Self Care | Attending: General Surgery

## 2023-06-21 ENCOUNTER — Encounter (HOSPITAL_BASED_OUTPATIENT_CLINIC_OR_DEPARTMENT_OTHER): Payer: Self-pay | Admitting: General Surgery

## 2023-06-21 DIAGNOSIS — E039 Hypothyroidism, unspecified: Secondary | ICD-10-CM | POA: Diagnosis not present

## 2023-06-21 DIAGNOSIS — F32A Depression, unspecified: Secondary | ICD-10-CM | POA: Diagnosis not present

## 2023-06-21 DIAGNOSIS — K648 Other hemorrhoids: Secondary | ICD-10-CM

## 2023-06-21 DIAGNOSIS — F419 Anxiety disorder, unspecified: Secondary | ICD-10-CM | POA: Insufficient documentation

## 2023-06-21 DIAGNOSIS — Z01818 Encounter for other preprocedural examination: Secondary | ICD-10-CM

## 2023-06-21 HISTORY — DX: Anxiety disorder, unspecified: F41.9

## 2023-06-21 HISTORY — PX: HEMORRHOID SURGERY: SHX153

## 2023-06-21 HISTORY — DX: Depression, unspecified: F32.A

## 2023-06-21 HISTORY — DX: Hypothyroidism, unspecified: E03.9

## 2023-06-21 LAB — POCT PREGNANCY, URINE: Preg Test, Ur: NEGATIVE

## 2023-06-21 SURGERY — HEMORRHOIDECTOMY
Anesthesia: General | Site: Rectum

## 2023-06-21 MED ORDER — PROPOFOL 10 MG/ML IV BOLUS
INTRAVENOUS | Status: DC | PRN
Start: 2023-06-21 — End: 2023-06-21
  Administered 2023-06-21: 200 mg via INTRAVENOUS

## 2023-06-21 MED ORDER — BUPIVACAINE LIPOSOME 1.3 % IJ SUSP: 20.0000 mL | Freq: Once | INTRAMUSCULAR | Status: DC

## 2023-06-21 MED ORDER — CELECOXIB 200 MG PO CAPS
ORAL_CAPSULE | ORAL | Status: AC
  Filled 2023-06-21: qty 2

## 2023-06-21 MED ORDER — CELECOXIB 200 MG PO CAPS
400.0000 mg | ORAL_CAPSULE | ORAL | Status: AC
  Administered 2023-06-21: 400 mg via ORAL

## 2023-06-21 MED ORDER — THROMBIN 5000 UNITS EX SOLR
CUTANEOUS | Status: DC | PRN
  Administered 2023-06-21: 5000 [IU] via TOPICAL

## 2023-06-21 MED ORDER — PROPOFOL 10 MG/ML IV BOLUS
INTRAVENOUS | Status: AC
  Filled 2023-06-21: qty 20

## 2023-06-21 MED ORDER — LACTATED RINGERS IV SOLN: INTRAVENOUS | Status: DC

## 2023-06-21 MED ORDER — ACETAMINOPHEN 500 MG PO TABS
ORAL_TABLET | ORAL | Status: AC
  Filled 2023-06-21: qty 2

## 2023-06-21 MED ORDER — LIDOCAINE 2% (20 MG/ML) 5 ML SYRINGE
INTRAMUSCULAR | Status: AC
  Filled 2023-06-21: qty 5

## 2023-06-21 MED ORDER — BUPIVACAINE LIPOSOME 1.3 % IJ SUSP
INTRAMUSCULAR | Status: AC
  Filled 2023-06-21: qty 20

## 2023-06-21 MED ORDER — ACETAMINOPHEN 500 MG PO TABS
1000.0000 mg | ORAL_TABLET | ORAL | Status: AC
  Administered 2023-06-21: 1000 mg via ORAL

## 2023-06-21 MED ORDER — GABAPENTIN 300 MG PO CAPS
ORAL_CAPSULE | ORAL | Status: AC
  Filled 2023-06-21: qty 1

## 2023-06-21 MED ORDER — CHLORHEXIDINE GLUCONATE CLOTH 2 % EX PADS: 6.0000 | MEDICATED_PAD | Freq: Once | CUTANEOUS | Status: DC

## 2023-06-21 MED ORDER — BUPIVACAINE LIPOSOME 1.3 % IJ SUSP
INTRAMUSCULAR | Status: DC | PRN
  Administered 2023-06-21: 20 mL

## 2023-06-21 MED ORDER — GABAPENTIN 300 MG PO CAPS
300.0000 mg | ORAL_CAPSULE | ORAL | Status: AC
  Administered 2023-06-21: 300 mg via ORAL

## 2023-06-21 MED ORDER — ENSURE PRE-SURGERY PO LIQD: 296.0000 mL | Freq: Once | ORAL | Status: DC

## 2023-06-21 MED ORDER — OXYCODONE HCL 5 MG PO TABS: 5.0000 mg | ORAL_TABLET | Freq: Four times a day (QID) | ORAL | 0 refills | Status: DC | PRN

## 2023-06-21 MED ORDER — BUPIVACAINE-EPINEPHRINE (PF) 0.5% -1:200000 IJ SOLN
INTRAMUSCULAR | Status: AC
  Filled 2023-06-21: qty 30

## 2023-06-21 MED ORDER — DEXMEDETOMIDINE HCL IN NACL 200 MCG/50ML IV SOLN
INTRAVENOUS | Status: DC | PRN
Start: 2023-06-21 — End: 2023-06-21
  Administered 2023-06-21: 12 ug via INTRAVENOUS

## 2023-06-21 MED ORDER — DEXAMETHASONE SODIUM PHOSPHATE 10 MG/ML IJ SOLN
INTRAMUSCULAR | Status: AC
  Filled 2023-06-21: qty 2

## 2023-06-21 MED ORDER — LIDOCAINE HCL (CARDIAC) PF 100 MG/5ML IV SOSY
PREFILLED_SYRINGE | INTRAVENOUS | Status: DC | PRN
  Administered 2023-06-21: 60 mg via INTRAVENOUS

## 2023-06-21 MED ORDER — MIDAZOLAM HCL 5 MG/5ML IJ SOLN
INTRAMUSCULAR | Status: DC | PRN
  Administered 2023-06-21: 2 mg via INTRAVENOUS

## 2023-06-21 MED ORDER — FLEET ENEMA RE ENEM: 1.0000 | ENEMA | Freq: Once | RECTAL | Status: DC

## 2023-06-21 MED ORDER — THROMBIN 5000 UNITS EX SOLR
CUTANEOUS | Status: AC
  Filled 2023-06-21: qty 5000

## 2023-06-21 MED ORDER — SODIUM CHLORIDE 0.9 % IV SOLN
2.0000 g | INTRAVENOUS | Status: AC
  Administered 2023-06-21: 2 g via INTRAVENOUS
  Filled 2023-06-21: qty 2

## 2023-06-21 MED ORDER — FENTANYL CITRATE (PF) 100 MCG/2ML IJ SOLN
INTRAMUSCULAR | Status: DC | PRN
  Administered 2023-06-21 (×2): 50 ug via INTRAVENOUS

## 2023-06-21 MED ORDER — DEXAMETHASONE SODIUM PHOSPHATE 10 MG/ML IJ SOLN
INTRAMUSCULAR | Status: DC | PRN
  Administered 2023-06-21: 5 mg via INTRAVENOUS

## 2023-06-21 MED ORDER — ONDANSETRON HCL 4 MG/2ML IJ SOLN
INTRAMUSCULAR | Status: AC
  Filled 2023-06-21: qty 4

## 2023-06-21 MED ORDER — FENTANYL CITRATE (PF) 100 MCG/2ML IJ SOLN
INTRAMUSCULAR | Status: AC
  Filled 2023-06-21: qty 2

## 2023-06-21 MED ORDER — ONDANSETRON HCL 4 MG/2ML IJ SOLN
INTRAMUSCULAR | Status: DC | PRN
  Administered 2023-06-21: 4 mg via INTRAVENOUS

## 2023-06-21 MED ORDER — MIDAZOLAM HCL 2 MG/2ML IJ SOLN
INTRAMUSCULAR | Status: AC
  Filled 2023-06-21: qty 2

## 2023-06-21 MED ORDER — FENTANYL CITRATE (PF) 100 MCG/2ML IJ SOLN: 25.0000 ug | INTRAMUSCULAR | Status: DC | PRN

## 2023-06-21 MED ORDER — BUPIVACAINE-EPINEPHRINE 0.5% -1:200000 IJ SOLN
INTRAMUSCULAR | Status: DC | PRN
  Administered 2023-06-21: 20 mL

## 2023-06-21 SURGICAL SUPPLY — 29 items
CANISTER SUCT 1200ML W/VALVE (MISCELLANEOUS) ×1 IMPLANT
COVER MAYO STAND STRL (DRAPES) ×1 IMPLANT
ELECT REM PT RETURN 9FT ADLT (ELECTROSURGICAL) ×1
ELECTRODE REM PT RTRN 9FT ADLT (ELECTROSURGICAL) ×1 IMPLANT
GAUZE 4X4 16PLY ~~LOC~~+RFID DBL (SPONGE) IMPLANT
GAUZE SPONGE 4X4 12PLY STRL (GAUZE/BANDAGES/DRESSINGS) ×1 IMPLANT
GLOVE BIO SURGEON STRL SZ8 (GLOVE) ×1 IMPLANT
GLOVE BIOGEL PI IND STRL 8 (GLOVE) ×1 IMPLANT
GOWN STRL REUS W/ TWL LRG LVL3 (GOWN DISPOSABLE) ×1 IMPLANT
GOWN STRL REUS W/ TWL XL LVL3 (GOWN DISPOSABLE) ×1 IMPLANT
GOWN STRL REUS W/TWL LRG LVL3 (GOWN DISPOSABLE) ×1
GOWN STRL REUS W/TWL XL LVL3 (GOWN DISPOSABLE) ×1
NDL HYPO 22X1.5 SAFETY MO (MISCELLANEOUS) ×1 IMPLANT
NEEDLE HYPO 22X1.5 SAFETY MO (MISCELLANEOUS) ×1
NS IRRIG 1000ML POUR BTL (IV SOLUTION) ×1 IMPLANT
PACK BASIN DAY SURGERY FS (CUSTOM PROCEDURE TRAY) ×1 IMPLANT
PACK LITHOTOMY IV (CUSTOM PROCEDURE TRAY) ×1 IMPLANT
PENCIL SMOKE EVACUATOR (MISCELLANEOUS) ×1 IMPLANT
SHEARS HARMONIC 9CM CVD (BLADE) ×1 IMPLANT
SLEEVE SCD COMPRESS KNEE MED (STOCKING) ×1 IMPLANT
SPONGE SURGIFOAM ABS GEL 100 (HEMOSTASIS) IMPLANT
SURGILUBE 2OZ TUBE FLIPTOP (MISCELLANEOUS) ×1 IMPLANT
SUT CHROMIC 2 0 SH (SUTURE) IMPLANT
SUT CHROMIC 3 0 SH 27 (SUTURE) IMPLANT
SYR CONTROL 10ML LL (SYRINGE) ×1 IMPLANT
TOWEL GREEN STERILE FF (TOWEL DISPOSABLE) ×2 IMPLANT
TUBE CONNECTING 20X1/4 (TUBING) ×1 IMPLANT
UNDERPAD 30X36 HEAVY ABSORB (UNDERPADS AND DIAPERS) ×1 IMPLANT
YANKAUER SUCT BULB TIP NO VENT (SUCTIONS) ×1 IMPLANT

## 2023-06-21 NOTE — Discharge Instructions (Addendum)
  Post Anesthesia Home Care Instructions  Activity: Get plenty of rest for the remainder of the day. A responsible individual must stay with you for 24 hours following the procedure.  For the next 24 hours, DO NOT: -Drive a car -Advertising copywriter -Drink alcoholic beverages -Take any medication unless instructed by your physician -Make any legal decisions or sign important papers.  Meals: Start with liquid foods such as gelatin or soup. Progress to regular foods as tolerated. Avoid greasy, spicy, heavy foods. If nausea and/or vomiting occur, drink only clear liquids until the nausea and/or vomiting subsides. Call your physician if vomiting continues.  Special Instructions/Symptoms: Your throat may feel dry or sore from the anesthesia or the breathing tube placed in your throat during surgery. If this causes discomfort, gargle with warm salt water. The discomfort should disappear within 24 hours.  If you had a scopolamine patch placed behind your ear for the management of post- operative nausea and/or vomiting:  1. The medication in the patch is effective for 72 hours, after which it should be removed.  Wrap patch in a tissue and discard in the trash. Wash hands thoroughly with soap and water. 2. You may remove the patch earlier than 72 hours if you experience unpleasant side effects which may include dry mouth, dizziness or visual disturbances. 3. Avoid touching the patch. Wash your hands with soap and water after contact with the patch.  Information for Discharge Teaching: EXPAREL (bupivacaine liposome injectable suspension)   Pain relief is important to your recovery. The goal is to control your pain so you can move easier and return to your normal activities as soon as possible after your procedure. Your physician may use several types of medicines to manage pain, swelling, and more.  Your surgeon or anesthesiologist gave you EXPAREL(bupivacaine) to help control your pain after  surgery.  EXPAREL is a local anesthetic designed to release slowly over an extended period of time to provide pain relief by numbing the tissue around the surgical site. EXPAREL is designed to release pain medication over time and can control pain for up to 72 hours. Depending on how you respond to EXPAREL, you may require less pain medication during your recovery. EXPAREL can help reduce or eliminate the need for opioids during the first few days after surgery when pain relief is needed the most. EXPAREL is not an opioid and is not addictive. It does not cause sleepiness or sedation.   Important! A teal colored band has been placed on your arm with the date, time and amount of EXPAREL you have received. Please leave this armband in place for the full 96 hours following administration, and then you may remove the band. If you return to the hospital for any reason within 96 hours following the administration of EXPAREL, the armband provides important information that your health care providers to know, and alerts them that you have received this anesthetic.    Possible side effects of EXPAREL: Temporary loss of sensation or ability to move in the area where medication was injected. Nausea, vomiting, constipation Rarely, numbness and tingling in your mouth or lips, lightheadedness, or anxiety may occur. Call your doctor right away if you think you may be experiencing any of these sensations, or if you have other questions regarding possible side effects.  Follow all other discharge instructions given to you by your surgeon or nurse. Eat a healthy diet and drink plenty of water or other fluids.

## 2023-06-21 NOTE — Anesthesia Preprocedure Evaluation (Addendum)
Anesthesia Evaluation  Patient identified by MRN, date of birth, ID band Patient awake    Reviewed: Allergy & Precautions, H&P , NPO status , Patient's Chart, lab work & pertinent test results  Airway Mallampati: II  TM Distance: >3 FB Neck ROM: Full    Dental no notable dental hx. (+) Teeth Intact, Dental Advisory Given   Pulmonary neg pulmonary ROS   Pulmonary exam normal breath sounds clear to auscultation       Cardiovascular negative cardio ROS  Rhythm:Regular Rate:Normal     Neuro/Psych   Anxiety Depression    negative neurological ROS  negative psych ROS   GI/Hepatic negative GI ROS, Neg liver ROS,,,  Endo/Other  Hypothyroidism    Renal/GU negative Renal ROS  negative genitourinary   Musculoskeletal   Abdominal   Peds  Hematology  (+) Blood dyscrasia, anemia   Anesthesia Other Findings   Reproductive/Obstetrics negative OB ROS                             Anesthesia Physical Anesthesia Plan  ASA: 2  Anesthesia Plan: General   Post-op Pain Management: Tylenol PO (pre-op)*   Induction: Intravenous  PONV Risk Score and Plan: 4 or greater and Ondansetron, Dexamethasone and Midazolam  Airway Management Planned: LMA  Additional Equipment:   Intra-op Plan:   Post-operative Plan: Extubation in OR  Informed Consent: I have reviewed the patients History and Physical, chart, labs and discussed the procedure including the risks, benefits and alternatives for the proposed anesthesia with the patient or authorized representative who has indicated his/her understanding and acceptance.     Dental advisory given  Plan Discussed with: CRNA  Anesthesia Plan Comments:        Anesthesia Quick Evaluation

## 2023-06-21 NOTE — Op Note (Signed)
  06/21/2023  12:38 PM  PATIENT:  Betty Chung  32 y.o. female  PRE-OPERATIVE DIAGNOSIS:  BLEEDING INTERNAL HEMORRHOIDS  POST-OPERATIVE DIAGNOSIS:  BLEEDING INTERNAL HEMORRHOIDS  PROCEDURE:  Procedure(s): INTERNAL HEMORRHOIDECTOMY 2 COLUMN  SURGEON:  Surgeon(s): Violeta Gelinas, MD  ASSISTANTS: none   ANESTHESIA:   local and general  EBL:  Total I/O In: 300 [I.V.:300] Out: 15 [Blood:15]  BLOOD ADMINISTERED:none  DRAINS: none   SPECIMEN:  Excision  DISPOSITION OF SPECIMEN:  PATHOLOGY  COUNTS:  YES  DICTATION: .Dragon Dictation Procedure in detail: Informed consent was obtained.  She received intravenous antibiotics.  She was brought the operating room and general endotracheal anesthesia was administered by the anesthesia staff.  She was placed in lithotomy position with appropriate padding.  Her perianal region was prepped and draped in a sterile fashion.  We did a timeout procedure.  I injected local for perianal postoperative pain relief.  This was a mixture of local and Exparel 50: 50.  Next, digital rectal exam revealed some soft masses posteriorly.  No hard masses.  Examination under anesthesia revealed 2 internal hemorrhoids, 1 with evidence of some recent bleeding.  1 was posterior with the recent bleeding and then the other 1 was over towards the right side.  Attention was directed to the posterior 1 first.  I incised the mucosa and then dissected the hemorrhoidal tissue using the harmonic scalpel and removed it.  I stayed superficial to the sphincters.  Hemostasis was obtained with cautery.  Similarly, I then removed the right sided hemorrhoid.  The mucosa was incised.  The hemorrhoid was then dissected and removed using the harmonic scalpel staying superficial to the sphincters.  Hemostasis was obtained with cautery.  I reinspected the rest of her rectum there were no other masses or significant hemorrhoids.  There was excellent hemostasis.  A Gelfoam roll soaked in  thrombin was placed as an endorectal dressing.  All counts were correct.  She tolerated the procedure well without apparent complication was taken recovery in stable condition. PATIENT DISPOSITION:  PACU - hemodynamically stable.   Delay start of Pharmacological VTE agent (>24hrs) due to surgical blood loss or risk of bleeding:  no  Violeta Gelinas, MD, MPH, FACS Pager: [phone]  8/26/202412:38 PM

## 2023-06-21 NOTE — Anesthesia Procedure Notes (Signed)
Procedure Name: LMA Insertion Date/Time: 06/21/2023 12:18 PM  Performed by: Karen Kitchens, CRNAPre-anesthesia Checklist: Patient identified, Emergency Drugs available, Suction available and Patient being monitored Patient Re-evaluated:Patient Re-evaluated prior to induction Oxygen Delivery Method: Circle system utilized Preoxygenation: Pre-oxygenation with 100% oxygen Induction Type: IV induction Ventilation: Mask ventilation without difficulty LMA: LMA inserted LMA Size: 4.0 Number of attempts: 1 Airway Equipment and Method: Bite block Placement Confirmation: positive ETCO2, CO2 detector and breath sounds checked- equal and bilateral Tube secured with: Tape Dental Injury: Teeth and Oropharynx as per pre-operative assessment

## 2023-06-21 NOTE — H&P (Signed)
Betty Chung is an 32 y.o. female.   Chief Complaint: Internal hemorrhoid HPI: Presents for internal hemorrhoidectomy.  Symptoms have been about the same since I last saw her in the office.  Past Medical History:  Diagnosis Date   Anxiety    Depression    History of molar pregnancy, antepartum 05/2019   Hypothyroidism    Missed ab    Wears glasses     Past Surgical History:  Procedure Laterality Date   DILATION AND EVACUATION N/A 06/06/2019   Procedure: DILATATION AND EVACUATION;  Surgeon: Philip Aspen, DO;  Location: [facility];  Service: Gynecology;  Laterality: N/A;   HYSTEROSCOPY WITH D & C N/A 06/18/2020   Procedure: DILATATION AND EVACUATION;  Surgeon: Philip Aspen, DO;  Location: [facility];  Service: Gynecology;  Laterality: N/A;   KNEE SURGERY Right 2007   mass removed from under patella (per pt benign)   WISDOM TOOTH EXTRACTION  2011    Family History  Problem Relation Age of Onset   Lupus Maternal Uncle    Uterine cancer Paternal Grandmother    Social History:  reports that she has never smoked. She has never been exposed to tobacco smoke. She has never used smokeless tobacco. She reports current alcohol use. She reports that she does not use drugs.  Allergies: No Known Allergies  Medications Prior to Admission  Medication Sig Dispense Refill   escitalopram (LEXAPRO) 10 MG tablet Take 1 tablet (10 mg total) by mouth daily. 90 tablet 3   levothyroxine (SYNTHROID) 25 MCG tablet Take 1 tablet (25 mcg total) by mouth daily. 30 tablet 2   lidocaine (XYLOCAINE) 5 % ointment Apply 1 Application topically as needed. 35.44 g 0    Results for orders placed or performed during the hospital encounter of 06/21/23 (from the past 48 hour(s))  Pregnancy, urine POC     Status: None   Collection Time: 06/21/23 11:27 AM  Result Value Ref Range   Preg Test, Ur NEGATIVE NEGATIVE    Comment:        THE SENSITIVITY OF THIS METHODOLOGY IS  >24 mIU/mL    No results found.  Review of Systems  Blood pressure 109/68, pulse 62, temperature 98.2 F (36.8 C), temperature source Oral, resp. rate 14, height 5' 4.25" (1.632 m), weight 76.7 kg, last menstrual period 06/07/2023, SpO2 100%, unknown if currently breastfeeding. Physical Exam Constitutional:      General: She is not in acute distress. Cardiovascular:     Rate and Rhythm: Normal rate and regular rhythm.  Pulmonary:     Effort: Pulmonary effort is normal.     Breath sounds: Normal breath sounds.  Abdominal:     General: Abdomen is flat.     Palpations: Abdomen is soft.     Tenderness: There is no abdominal tenderness.  Genitourinary:    Comments: Refer to office note Musculoskeletal:        General: No deformity.  Skin:    General: Skin is warm and dry.  Neurological:     Mental Status: She is alert and oriented to person, place, and time.  Psychiatric:        Mood and Affect: Mood normal.      Assessment/Plan Internal hemorrhoid -for internal hemorrhoidectomy.  Procedure, risks, and benefits were again discussed.  I also discussed the expected postoperative course.  She is agreeable.  I answered her questions.  [name], MD 06/21/2023, 11:52 AM

## 2023-06-21 NOTE — Transfer of Care (Signed)
Immediate Anesthesia Transfer of Care Note  Patient: [name]  Procedure(s) Performed: INTERNAL HEMORRHOIDECTOMY (Rectum)  Patient Location: PACU  Anesthesia Type:General  Level of Consciousness: awake, alert , and patient cooperative  Airway & Oxygen Therapy: Patient Spontanous Breathing and Patient connected to face mask oxygen  Post-op Assessment: Report given to RN and Post -op Vital signs reviewed and stable  Post vital signs: Reviewed and stable  Last Vitals:  Vitals Value Taken Time  BP 113/67 06/21/23 1251  Temp    Pulse 75 06/21/23 1251  Resp 14 06/21/23 1251  SpO2 100 % 06/21/23 1251    Last Pain:  Vitals:   06/21/23 1143  TempSrc: Oral  PainSc: 0-No pain         Complications: No notable events documented.

## 2023-06-21 NOTE — Anesthesia Postprocedure Evaluation (Signed)
Anesthesia Post Note  Patient: Betty Chung  Procedure(s) Performed: INTERNAL HEMORRHOIDECTOMY (Rectum)     Patient location during evaluation: PACU Anesthesia Type: General Level of consciousness: awake and alert Pain management: pain level controlled Vital Signs Assessment: post-procedure vital signs reviewed and stable Respiratory status: spontaneous breathing, nonlabored ventilation and respiratory function stable Cardiovascular status: blood pressure returned to baseline and stable Postop Assessment: no apparent nausea or vomiting Anesthetic complications: no  No notable events documented.  Last Vitals:  Vitals:   06/21/23 1315 06/21/23 1330  BP: 110/61 113/71  Pulse: 62 66  Resp: 14 16  Temp:  (!) 36.2 C  SpO2: 100% 95%    Last Pain:  Vitals:   06/21/23 1330  TempSrc:   PainSc: 2                  Tylee Newby,W. EDMOND

## 2023-06-22 ENCOUNTER — Encounter (HOSPITAL_BASED_OUTPATIENT_CLINIC_OR_DEPARTMENT_OTHER): Payer: Self-pay | Admitting: General Surgery

## 2023-06-23 LAB — SURGICAL PATHOLOGY

## 2023-06-30 ENCOUNTER — Ambulatory Visit (INDEPENDENT_AMBULATORY_CARE_PROVIDER_SITE_OTHER): Payer: Medicaid Other | Admitting: Dermatology

## 2023-06-30 ENCOUNTER — Encounter: Payer: Self-pay | Admitting: Dermatology

## 2023-06-30 DIAGNOSIS — D224 Melanocytic nevi of scalp and neck: Secondary | ICD-10-CM

## 2023-06-30 DIAGNOSIS — Z808 Family history of malignant neoplasm of other organs or systems: Secondary | ICD-10-CM | POA: Diagnosis not present

## 2023-06-30 DIAGNOSIS — D485 Neoplasm of uncertain behavior of skin: Secondary | ICD-10-CM

## 2023-06-30 DIAGNOSIS — D229 Melanocytic nevi, unspecified: Secondary | ICD-10-CM

## 2023-06-30 DIAGNOSIS — D2272 Melanocytic nevi of left lower limb, including hip: Secondary | ICD-10-CM

## 2023-06-30 NOTE — Patient Instructions (Addendum)
Hello Betty Chung,   Thank you for visiting my office today. Your dedication to your skin health and addressing your concerns is greatly appreciated. Below is a summary of our discussion and the essential instructions for your care:  - Observation: We will continue to monitor the light brown macule on your lower calf without the need for a biopsy at this time.  - Biopsies: Shave Biopsies were performed on two spots on your neck:   - A 5.31mm brown fleshy papule on the posterior neck superior.   - A 3.76mm brown fleshy papule on the posterior neck inferior.     - Post-procedure Care:   - Cleaning: Keep the biopsy areas clean using regular soap and water.   - Treatment: Apply Vaseline or Aquaphor, followed by a Band-Aid to the treated areas.  - Results: You will be notified through MyChart once the biopsy results are available.  - Follow-Up: Please schedule a full head-to-toe skin cancer screening for the future.  - Sun Protection: Ensure to apply sunscreen when outdoors, especially at [employer] or while playing outside, with reapplication every three hours.  Should you have any questions or concerns before our next appointment, please do not hesitate to reach out to the office.  Warm regards,  Dr. Langston Reusing, MD Dermatology      Patient Handout: Wound Care for Skin Biopsy Site  Taking Care of Your Skin Biopsy Site  Proper care of the biopsy site is essential for promoting healing and minimizing scarring. This handout provides instructions on how to care for your biopsy site to ensure optimal recovery.  1. Cleaning the Wound:  Clean the biopsy site daily with gentle soap and water. Gently pat the area dry with a clean, soft towel. Avoid harsh scrubbing or rubbing the area, as this can irritate the skin and delay healing.  2. Applying Aquaphor and Bandage:  After cleaning the wound, apply a thin layer of Aquaphor ointment to the biopsy site. Cover the area with a sterile  bandage to protect it from dirt, bacteria, and friction. Change the bandage daily or as needed if it becomes soiled or wet.  3. Continued Care for One Week:  Repeat the cleaning, Aquaphor application, and bandaging process daily for one week following the biopsy procedure. Keeping the wound clean and moist during this initial healing period will help prevent infection and promote optimal healing.  4. Massaging Aquaphor into the Area:  ---After one week, discontinue the use of bandages but continue to apply Aquaphor to the biopsy site. ----Gently massage the Aquaphor into the area using circular motions. ---Massaging the skin helps to promote circulation and prevent the formation of scar tissue.   Additional Tips:  Avoid exposing the biopsy site to direct sunlight during the healing process, as this can cause hyperpigmentation or worsen scarring. If you experience any signs of infection, such as increased redness, swelling, warmth, or drainage from the wound, contact your healthcare provider immediately. Follow any additional instructions provided by your healthcare provider for caring for the biopsy site and managing any discomfort. Conclusion:  Taking proper care of your skin biopsy site is crucial for ensuring optimal healing and minimizing scarring. By following these instructions for cleaning, applying Aquaphor, and massaging the area, you can promote a smooth and successful recovery. If you have any questions or concerns about caring for your biopsy site, don't hesitate to contact your healthcare provider for guidance.   Important Information  Due to recent changes in healthcare laws, you  may see results of your pathology and/or laboratory studies on MyChart before the doctors have had a chance to review them. We understand that in some cases there may be results that are confusing or concerning to you. Please understand that not all results are received at the same time and often the  doctors may need to interpret multiple results in order to provide you with the best plan of care or course of treatment. Therefore, we ask that you please give [place] 2 business days to thoroughly review all your results before contacting the office for clarification. Should we see a critical lab result, you will be contacted sooner.   If You Need Anything After Your Visit  If you have any questions or concerns for your doctor, please call our main line at [phone] If no one answers, please leave a voicemail as directed and we will return your call as soon as possible. Messages left after 4 pm will be answered the following business day.   You may also send [place] a message via MyChart. We typically respond to MyChart messages within 1-2 business days.  For prescription refills, please ask your pharmacy to contact our office. Our fax number is [phone].  If you have an urgent issue when the clinic is closed that cannot wait until the next business day, you can page your doctor at the number below.    Please note that while we do our best to be available for urgent issues outside of office hours, we are not available 24/7.   If you have an urgent issue and are unable to reach [place], you may choose to seek medical care at your doctor's office, retail clinic, urgent care center, or emergency room.  If you have a medical emergency, please immediately call 911 or go to the emergency department. In the event of inclement weather, please call our main line at [phone] for an update on the status of any delays or closures.  Dermatology Medication Tips: Please keep the boxes that topical medications come in in order to help keep track of the instructions about where and how to use these. Pharmacies typically print the medication instructions only on the boxes and not directly on the medication tubes.   If your medication is too expensive, please contact our office at [phone] or send [place] a message  through MyChart.   We are unable to tell what your co-pay for medications will be in advance as this is different depending on your insurance coverage. However, we may be able to find a substitute medication at lower cost or fill out paperwork to get insurance to cover a needed medication.   If a prior authorization is required to get your medication covered by your insurance company, please allow [place] 1-2 business days to complete this process.  Drug prices often vary depending on where the prescription is filled and some pharmacies may offer cheaper prices.  The website www.goodrx.com contains coupons for medications through different pharmacies. The prices here do not account for what the cost may be with help from insurance (it may be cheaper with your insurance), but the website can give you the price if you did not use any insurance.  - You can print the associated coupon and take it with your prescription to the pharmacy.  - You may also stop by our office during regular business hours and pick up a GoodRx coupon card.  - If you need your prescription sent electronically to a different pharmacy,  notify our office through [facility] or by phone at [phone]

## 2023-06-30 NOTE — Progress Notes (Signed)
New Patient Visit   Subjective  Betty Chung is a 32 y.o. female who presents for the following: Nevi  Patient states she has nevi located at the neck and lower legs that she would like to have examined. Patient reports the areas have been there for  about 1  year(s). She reports the areas are bothersome. Patient states that the areas have not spread. Patient reports has not previously been treated for these areas. Patient denies Hx of bx. Patient reports family history of skin cancer(s)(Mom and grandmom).    The following portions of the chart were reviewed this encounter and updated as appropriate: medications, allergies, medical history  Review of Systems:  No other skin or systemic complaints except as noted in HPI or Assessment and Plan.  Objective  Well appearing patient in no apparent distress; mood and affect are within normal limits.  A focused examination was performed of the following areas: Posterior Neck and LLE  Relevant exam findings are noted in the Assessment and Plan.  Neck - Posterior 5.5 mm brown fleshy papule  Neck - Posterior 3 mm brown fleshy papule              Assessment & Plan   1. Light Brown Macule on Lower Calf (6mm x 4mm) - Assessment: Light brown macule likely resulting from sun exposure, measuring 6mm x 4mm on the lower calf. No growth or bothersome symptoms reported. No personal history of skin cancer, though family history is present. - Plan: Monitor the spot on the leg during future visits. Recommend regular sunscreen application and sun protection measures.  2. Manson Passey Fleshy Papules on Posterior Neck - Assessment: Two brown fleshy papules located on the superior (5.34mm) and inferior (3.52mm) parts of the posterior neck, which appeared since early pregnancy last year and have grown. - Plan: Perform biopsies on both papules to rule out dysplasia. Numb the area with lidocaine prior to biopsy. Instruct patient to keep the areas clean with  soap and water, apply Vaseline or Aquaphor, and cover with a Band-Aid. Send biopsy results via MyChart message and discuss further management if needed.  3. Skin Cancer Screening - Plan: Schedule a full head-to-toe skin cancer screening for the patient in the near future.  4. Wynelle Link Protection Education - Plan: Educate the patient on the importance of sunscreen application, especially during outdoor activities and beach visits. Recommend reapplying sunscreen every three hours for optimal protection.     MELANOCYTIC NEVI Exam: 6 mm x 4 mm Light-brown colored symmetric macules Lower Left Leg, Posterior Neck 5.5 mm brown fleshy pap posterior inferior 3mm brown fleshy papule  PROCEDURE NOTE Neoplasm of uncertain behavior of skin (2) Neck - Posterior  Skin / nail biopsy Type of biopsy: tangential   Informed consent: discussed and consent obtained   Timeout: patient name, date of birth, surgical site, and procedure verified   Procedure prep:  Patient was prepped and draped in usual sterile fashion Prep type:  Isopropyl alcohol Anesthesia: the lesion was anesthetized in a standard fashion   Anesthetic:  1% lidocaine w/ epinephrine 1-100,000 buffered w/ 8.4% NaHCO3 Instrument used: DermaBlade   Hemostasis achieved with: aluminum chloride   Outcome: patient tolerated procedure well   Post-procedure details: sterile dressing applied and wound care instructions given   Dressing type: petrolatum gauze and bandage    Specimen 1 - Surgical pathology Differential Diagnosis: DN  Check Margins: No  Neck - Posterior  Skin / nail biopsy Type of biopsy: tangential  Informed consent: discussed and consent obtained   Timeout: patient name, date of birth, surgical site, and procedure verified   Procedure prep:  Patient was prepped and draped in usual sterile fashion Prep type:  Isopropyl alcohol Anesthesia: the lesion was anesthetized in a standard fashion   Anesthetic:  1% lidocaine w/  epinephrine 1-100,000 buffered w/ 8.4% NaHCO3 Instrument used: DermaBlade   Hemostasis achieved with: aluminum chloride   Outcome: patient tolerated procedure well   Post-procedure details: sterile dressing applied and wound care instructions given   Dressing type: petrolatum gauze and bandage    Specimen 2 - Surgical pathology Differential Diagnosis: DN  Check Margins: No    Return if symptoms worsen or fail to improve, for TBSE.  Documentation: I have reviewed the above documentation for accuracy and completeness, and I agree with the above.  Stasia Cavalier, am acting as scribe for Langston Reusing, DO.  Langston Reusing, DO

## 2023-07-14 ENCOUNTER — Encounter: Payer: Self-pay | Admitting: Family Medicine

## 2023-07-14 ENCOUNTER — Ambulatory Visit: Payer: Medicaid Other | Admitting: Family Medicine

## 2023-07-14 VITALS — BP 100/66 | HR 86 | Ht 64.25 in | Wt 169.0 lb

## 2023-07-14 DIAGNOSIS — B309 Viral conjunctivitis, unspecified: Secondary | ICD-10-CM | POA: Insufficient documentation

## 2023-07-14 DIAGNOSIS — M25561 Pain in right knee: Secondary | ICD-10-CM | POA: Insufficient documentation

## 2023-07-14 NOTE — Assessment & Plan Note (Signed)
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  Now with 2 weeks of worsening right knee pain.  Symptoms sound consistent with meniscal injury but given her history of tumor in that knee, will refer to orthopedics.  Will get x-rays of the knee while awaiting referral but will likely need MRI if there is concern for recurrence of her benign tumor.  Discussed using topical NSAIDs and Tylenol in the meantime.  Patient not breast-feeding and is not pregnant at this time.

## 2023-07-14 NOTE — Patient Instructions (Signed)
It was nice to see you today,  We addressed the following topics today: -Your eye is likely viral or allergic conjunctivitis.  There are over-the-counter antihistamine eyedrops called Pataday that can be used for allergic conjunctivitis.  Bacterial conjunctivitis usually is associated with severe crusting, discharge and redness. - For your knee I will put in an x-ray order for [facility] imaging you can have that done at any time when they are open.  They typically allow walk-ins for x-rays. - I will send in a referral for the orthopedist for your knee given the fact that you have a history of benign tumor in the knee.  Have a great day,  Frederic Jericho, MD

## 2023-07-14 NOTE — Progress Notes (Unsigned)
Acute Office Visit  Subjective:     Patient ID: Betty Chung, female    DOB: [birth year], 32 y.o.   MRN: [id number]  Chief Complaint  Patient presents with   Eye Problem    HPI Patient is in today for eye complaint.    Patient complaining of left eye pain for the past 2 days.  Woke up with blurred vision and a "goopy" feeling in the left eye.  Over the next day it became more irritated and painful.  Describes it as swollen.  States it was swollen shut when she woke up 2 days ago.  Yesterday at the symptoms started to improve.  No double vision.  Patient has an 52-month-old baby that goes to daycare.  Patient is currently sexually active with fianc.  Denies that any bodily fluids may have come into contact with her eye recently.  Right knee pain-patient states when she was 13 she had right knee surgery for a benign tumor found in her knee.  Initial symptoms were an inability to flex her knee suddenly.  MRI showed bony tumor and this was surgically removed.  For the past 2 weeks she has noticed that her right knee pain is worsening.  Sometimes feels like it is giving out on her.  Twisting motions and sudden change in directions make pain worse.   ROS      Objective:    BP 100/66   Pulse 86   Ht 5' 4.25" (1.632 m)   Wt 169 lb (76.7 kg)   LMP 06/07/2023 (Exact Date)   SpO2 99%   BMI 28.78 kg/m  {Vitals History (Optional):23777}  Physical Exam General: Alert, oriented HEENT: PERRLA, EOMI.  Normal sclera bilaterally.  No swelling.  No discharge noted in the eyes.  Extraocular movements intact. MSK: Right knee with normal range of motion.  No swelling or asymmetry between the knees.  Nontender to palpation.  No results found for any visits on 07/14/23.      Assessment & Plan:   Viral conjunctivitis Assessment & Plan: Eye appears normal on exam today.  Likely picked up a viral infection from her infant who goes to daycare.  She discussed possibility of allergic  conjunctivitis.  Discussed signs and symptoms of bacterial conjunctivitis advised her to inform us if she notices worsening of her symptoms. - Discussed over-the-counter Pataday for allergic conjunctivitis if symptoms return.   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  Now with 2 weeks of worsening right knee pain.  Symptoms sound consistent with meniscal injury but given her history of tumor in that knee, will refer to orthopedics.  Will get x-rays of the knee while awaiting referral but will likely need MRI if there is concern for recurrence of her benign tumor.  Discussed using topical NSAIDs and Tylenol in the meantime.  Patient not breast-feeding and is not pregnant at this time.  Orders: -     DG Knee Complete 4 Views Right; Future -     Ambulatory referral to Orthopedics     Return if symptoms worsen or fail to improve.  Sandre Kitty, MD

## 2023-07-14 NOTE — Assessment & Plan Note (Signed)
Eye appears normal on exam today.  Likely picked up a viral infection from her infant who goes to daycare.  She discussed possibility of allergic conjunctivitis.  Discussed signs and symptoms of bacterial conjunctivitis advised her to inform us if she notices worsening of her symptoms. - Discussed over-the-counter Pataday for allergic conjunctivitis if symptoms return.

## 2023-07-21 ENCOUNTER — Ambulatory Visit
Admission: RE | Admit: 2023-07-21 | Discharge: 2023-07-21 | Disposition: A | Discharge status: Home / Self Care | Payer: Medicaid Other | Source: Ambulatory Visit | Attending: Family Medicine | Admitting: Family Medicine

## 2023-07-21 DIAGNOSIS — M25561 Pain in right knee: Secondary | ICD-10-CM

## 2023-07-22 ENCOUNTER — Ambulatory Visit: Payer: Medicaid Other | Admitting: Orthopaedic Surgery

## 2023-07-22 ENCOUNTER — Other Ambulatory Visit: Payer: Self-pay

## 2023-07-22 DIAGNOSIS — G8929 Other chronic pain: Secondary | ICD-10-CM

## 2023-07-22 DIAGNOSIS — M25561 Pain in right knee: Secondary | ICD-10-CM

## 2023-07-22 DIAGNOSIS — Z0000 Encounter for general adult medical examination without abnormal findings: Secondary | ICD-10-CM

## 2023-07-22 NOTE — Progress Notes (Signed)
Office Visit Note   Patient: Betty Chung           Date of Birth: 06-18-1991           MRN: [id number] Visit Date: 07/22/2023              Requested by: Betty Kitty, MD [address] [place],  [place] [address] PCP: Betty Chung, [place]   Assessment & Plan: Visit Diagnoses:  1. Chronic pain of right knee     Plan: Betty Chung is a 32 year old female with right patellofemoral pain.  X-rays are unremarkable.  Recommend physical therapy for strengthening.  Over-the-counter medications as needed.  Follow-up as needed.  Follow-Up Instructions: No follow-ups on file.   Orders:  Orders Placed This Encounter  Procedures   Ambulatory referral to Physical Therapy   No orders of the defined types were placed in this encounter.     Procedures: No procedures performed   Clinical Data: No additional findings.   Subjective: Chief Complaint  Patient presents with   Right Knee - Pain    HPI Betty Chung is a 32 year old female who comes in for right knee pain on and off for several months with recent worsening in the last 3 weeks.  Denies any recent changes in activity or weight or injuries.  Had a benign tumor taken out arthroscopically out of the right knee in 2006.  She is reporting some subjective swelling and popping but denies weakness and giving way or stiffness.  Denies constitutional symptoms. Review of Systems  Constitutional: Negative.   HENT: Negative.    Eyes: Negative.   Respiratory: Negative.    Cardiovascular: Negative.   Endocrine: Negative.   Musculoskeletal: Negative.   Neurological: Negative.   Hematological: Negative.   Psychiatric/Behavioral: Negative.    All other systems reviewed and are negative.    Objective: Vital Signs: There were no vitals taken for this visit.  Physical Exam Vitals and nursing note reviewed.  Constitutional:      Appearance: She is well-developed.  HENT:     Head: Atraumatic.     Nose: Nose normal.  Eyes:      Extraocular Movements: Extraocular movements intact.  Cardiovascular:     Pulses: Normal pulses.  Pulmonary:     Effort: Pulmonary effort is normal.  Abdominal:     Palpations: Abdomen is soft.  Musculoskeletal:     Cervical back: Neck supple.  Skin:    General: Skin is warm.     Capillary Refill: Capillary refill takes less than 2 seconds.  Neurological:     Mental Status: She is alert. Mental status is at baseline.  Psychiatric:        Behavior: Behavior normal.        Thought Content: Thought content normal.        Judgment: Judgment normal.     Ortho Exam Exam of the right knee shows fully healed arthroscopic surgical scars.  No joint effusion.  Collaterals and cruciates are stable.  Normal range of motion.  Minimal amount of anterior medial joint line tenderness along the anterior medial portal. Specialty Comments:  No specialty comments available.  Imaging: No results found.   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   Past Medical History:  Diagnosis Date   Anxiety    Depression    History of molar pregnancy, antepartum 05/2019   Hypothyroidism    Missed ab    Wears glasses     Family History  Problem Relation Age of Onset   Lupus Maternal Uncle    Uterine cancer Paternal Grandmother     Past Surgical History:  Procedure Laterality Date   DILATION AND EVACUATION N/A 06/06/2019   Procedure: DILATATION AND EVACUATION;  Surgeon: Philip Aspen, DO;  Location: [facility];  Service: Gynecology;  Laterality: N/A;   HEMORRHOID SURGERY N/A 06/21/2023   Procedure:  INTERNAL HEMORRHOIDECTOMY;  Surgeon: Violeta Gelinas, MD;  Location: [facility];  Service: General;  Laterality: N/A;   HYSTEROSCOPY WITH D & C N/A 06/18/2020   Procedure: DILATATION AND EVACUATION;  Surgeon: Philip Aspen, DO;  Location: [facility];  Service: Gynecology;  Laterality: N/A;   KNEE SURGERY Right 2007   mass removed from under patella (per pt benign)   WISDOM TOOTH EXTRACTION  2011   Social History   Occupational History   Not on file  Tobacco Use   Smoking status: Never    Passive exposure: Never   Smokeless tobacco: Never  Vaping Use   Vaping status: Never Used  Substance and Sexual Activity   Alcohol use: Yes   Drug use: Never   Sexual activity: Yes    Birth control/protection: None

## 2023-07-26 ENCOUNTER — Other Ambulatory Visit: Payer: Self-pay | Admitting: Family Medicine

## 2023-07-26 DIAGNOSIS — E038 Other specified hypothyroidism: Secondary | ICD-10-CM

## 2023-07-29 ENCOUNTER — Other Ambulatory Visit: Payer: Medicaid Other

## 2023-07-29 DIAGNOSIS — Z0000 Encounter for general adult medical examination without abnormal findings: Secondary | ICD-10-CM

## 2023-07-30 LAB — COMPREHENSIVE METABOLIC PANEL
ALT: 14 [IU]/L (ref 0–32)
AST: 14 [IU]/L (ref 0–40)
Albumin: 4.3 g/dL (ref 3.9–4.9)
Alkaline Phosphatase: 85 [IU]/L (ref 44–121)
BUN/Creatinine Ratio: 17 (ref 9–23)
BUN: 13 mg/dL (ref 6–20)
Bilirubin Total: 0.4 mg/dL (ref 0.0–1.2)
CO2: 24 mmol/L (ref 20–29)
Calcium: 9.5 mg/dL (ref 8.7–10.2)
Chloride: 101 mmol/L (ref 96–106)
Creatinine, Ser: 0.77 mg/dL (ref 0.57–1.00)
Globulin, Total: 2.8 g/dL (ref 1.5–4.5)
Glucose: 96 mg/dL (ref 70–99)
Potassium: 4 mmol/L (ref 3.5–5.2)
Sodium: 140 mmol/L (ref 134–144)
Total Protein: 7.1 g/dL (ref 6.0–8.5)
eGFR: 105 mL/min/{1.73_m2} (ref >59)

## 2023-07-30 LAB — CBC WITH DIFFERENTIAL/PLATELET
Basophils Absolute: 0 10*3/uL (ref 0.0–0.2)
Basos: 1 %
EOS (ABSOLUTE): 0.2 10*3/uL (ref 0.0–0.4)
Eos: 3 %
Hematocrit: 38.3 % (ref 34.0–46.6)
Hemoglobin: 11.7 g/dL (ref 11.1–15.9)
Immature Grans (Abs): 0 10*3/uL (ref 0.0–0.1)
Immature Granulocytes: 0 %
Lymphocytes Absolute: 1.5 10*3/uL (ref 0.7–3.1)
Lymphs: 22 %
MCH: 25.3 pg — ABNORMAL LOW (ref 26.6–33.0)
MCHC: 30.5 g/dL — ABNORMAL LOW (ref 31.5–35.7)
MCV: 83 fL (ref 79–97)
Monocytes Absolute: 0.5 10*3/uL (ref 0.1–0.9)
Monocytes: 8 %
Neutrophils Absolute: 4.4 10*3/uL (ref 1.4–7.0)
Neutrophils: 66 %
Platelets: 245 10*3/uL (ref 150–450)
RBC: 4.62 x10E6/uL (ref 3.77–5.28)
RDW: 15.1 % (ref 11.7–15.4)
WBC: 6.6 10*3/uL (ref 3.4–10.8)

## 2023-07-30 LAB — LIPID PANEL
Chol/HDL Ratio: 3.2 {ratio} (ref 0.0–4.4)
Cholesterol, Total: 156 mg/dL (ref 100–199)
HDL: 49 mg/dL (ref >39)
LDL Chol Calc (NIH): 94 mg/dL (ref 0–99)
Triglycerides: 65 mg/dL (ref 0–149)
VLDL Cholesterol Cal: 13 mg/dL (ref 5–40)

## 2023-07-30 LAB — HEMOGLOBIN A1C
Est. average glucose Bld gHb Est-mCnc: 117 mg/dL
Hgb A1c MFr Bld: 5.7 % — ABNORMAL HIGH (ref 4.8–5.6)

## 2023-07-30 LAB — TSH: TSH: 3.47 u[IU]/mL (ref 0.450–4.500)

## 2023-08-04 NOTE — Progress Notes (Unsigned)
Complete physical exam  Patient: Betty Chung   DOB: 05-06-1991   32 y.o. Female  MRN: [id number]  Subjective:    No chief complaint on file.   Betty Chung is a 32 y.o. female who presents today for a complete physical exam. She reports consuming a {diet types:17450} diet. {types:19826} She generally feels {DESC; WELL/FAIRLY WELL/POORLY:18703}. She reports sleeping {DESC; WELL/FAIRLY WELL/POORLY:18703}. She {does/does not:200015} have additional problems to discuss today.    Most recent fall risk assessment:    03/19/2023   11:17 AM  Fall Risk   Falls in the past year? 0  Number falls in past yr: 0  Injury with Fall? 0  Risk for fall due to : No Fall Risks  Follow up Falls evaluation completed     Most recent depression and anxiety screenings:    04/30/2023    9:36 AM 03/26/2023    8:38 AM  PHQ 2/9 Scores  PHQ - 2 Score 0 2  PHQ- 9 Score 1 6      04/30/2023    9:36 AM 03/26/2023    8:39 AM 03/19/2023   11:17 AM 02/02/2023    4:32 PM  GAD 7 : Generalized Anxiety Score  Nervous, Anxious, on Edge 1 3 3  0  Control/stop worrying 0 3 3 0  Worry too much - different things 0 3 3 0  Trouble relaxing 1 3 3  0  Restless 0 1 0 0  Easily annoyed or irritable 1 3 3 1   Afraid - awful might happen 1 3 1 1   Total GAD 7 Score 4 19 16 2   Anxiety Difficulty Somewhat difficult Very difficult Very difficult Not difficult at all    Patient Active Problem List   Diagnosis Date Noted   Viral conjunctivitis 07/14/2023   Acute pain of right knee 07/14/2023   Anemia 03/26/2023   GAD (generalized anxiety disorder) 03/26/2023   Postpartum depression 03/26/2023   Multiple nevi 02/02/2023   Grade I hemorrhoids 02/02/2023   Hypothyroidism 04/06/2022   Antiphospholipid syndrome (HCC) 12/31/2020   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    Past Surgical History:  Procedure Laterality Date   DILATION AND EVACUATION N/A 06/06/2019   Procedure: DILATATION AND EVACUATION;  Surgeon:  Philip Aspen, DO;  Location: [facility];  Service: Gynecology;  Laterality: N/A;   HEMORRHOID SURGERY N/A 06/21/2023   Procedure: INTERNAL HEMORRHOIDECTOMY;  Surgeon: Violeta Gelinas, MD;  Location: [facility];  Service: General;  Laterality: N/A;   HYSTEROSCOPY WITH D & C N/A 06/18/2020   Procedure: DILATATION AND EVACUATION;  Surgeon: Philip Aspen, DO;  Location: [facility] [place];  Service: Gynecology;  Laterality: N/A;   KNEE SURGERY Right 2007   mass removed from under patella (per pt benign)   WISDOM TOOTH EXTRACTION  2011   Social History   Tobacco Use   Smoking status: Never    Passive exposure: Never   Smokeless tobacco: Never  Vaping Use   Vaping status: Never Used  Substance Use Topics   Alcohol use: Yes   Drug use: Never   Family History  Problem Relation Age of Onset   Lupus Maternal Uncle    Uterine cancer Paternal Grandmother    No Known Allergies   Patient Care Team: Melida Quitter, PA as PCP - General (Family Medicine)   Outpatient Medications Prior to Visit  Medication Sig   escitalopram (LEXAPRO) 10 MG tablet Take 1 tablet (10 mg total) by mouth daily.   levothyroxine (  SYNTHROID) 25 MCG tablet TAKE 1 TABLET (25 MCG TOTAL) BY MOUTH DAILY.   lidocaine (XYLOCAINE) 5 % ointment Apply 1 Application topically as needed.   oxyCODONE (OXY IR/ROXICODONE) 5 MG immediate release tablet Take 1 tablet (5 mg total) by mouth every 6 (six) hours as needed for up to 20 doses for severe pain. (Patient not taking: Reported on 06/30/2023)   No facility-administered medications prior to visit.    ROS    Objective:    There were no vitals taken for this visit.   Physical Exam    Assessment & Plan:    Routine Health Maintenance and Physical Exam  Immunization History  Administered Date(s) Administered   Influenza Inj Mdck Quad Pf 08/25/2022   Tdap 08/11/2022    Health Maintenance  Topic Date Due   COVID-19  Vaccine (1) Never done   INFLUENZA VACCINE  01/24/2024 (Originally 05/27/2023)   Cervical Cancer Screening (HPV/Pap Cotest)  10/15/2026   DTaP/Tdap/Td (2 - Td or Tdap) 08/11/2032   Hepatitis C Screening  Completed   HIV Screening  Completed   HPV VACCINES  Aged Out    Reviewed most recent labs including CBC, CMP, lipid panel, A1C, TSH, and vitamin D. All within normal limits/stable from last check other than ***. Discussed health benefits of physical activity, and encouraged her to engage in regular exercise appropriate for her age and condition.  There are no diagnoses linked to this encounter.  No follow-ups on file.     Melida Quitter, PA

## 2023-08-05 ENCOUNTER — Encounter: Payer: Self-pay | Admitting: Family Medicine

## 2023-08-05 ENCOUNTER — Ambulatory Visit (INDEPENDENT_AMBULATORY_CARE_PROVIDER_SITE_OTHER): Payer: Medicaid Other | Admitting: Family Medicine

## 2023-08-05 VITALS — BP 102/71 | HR 93 | Resp 18 | Ht 64.25 in | Wt 167.0 lb

## 2023-08-05 DIAGNOSIS — Z0000 Encounter for general adult medical examination without abnormal findings: Secondary | ICD-10-CM | POA: Diagnosis not present

## 2023-08-05 DIAGNOSIS — F53 Postpartum depression: Secondary | ICD-10-CM | POA: Diagnosis not present

## 2023-08-05 DIAGNOSIS — E039 Hypothyroidism, unspecified: Secondary | ICD-10-CM

## 2023-08-05 DIAGNOSIS — F411 Generalized anxiety disorder: Secondary | ICD-10-CM | POA: Diagnosis not present

## 2023-08-05 MED ORDER — LEVOTHYROXINE SODIUM 25 MCG PO TABS: 25.0000 ug | ORAL_TABLET | Freq: Every day | ORAL | 1 refills | Status: DC

## 2023-08-05 NOTE — Patient Instructions (Signed)
It is always lovely to see you!  For now, continue the thyroid medicine.  We will recheck your thyroid in about 6 months to see if we need to make any adjustments at that time.

## 2023-08-05 NOTE — Assessment & Plan Note (Signed)
Most recent TSH within normal limits at 3.470.  Continue levothyroxine 25 mcg and recheck thyroid labs in 6 months.

## 2023-08-05 NOTE — Assessment & Plan Note (Signed)
Stable.  Continue Lexapro 10 mg daily.  Will continue to monitor.

## 2023-08-05 NOTE — Assessment & Plan Note (Signed)
Stable, PHQ-9 score of 1 and PHQ 2 score of 0.  Continue Lexapro 10 mg daily.  Will continue to monitor.

## 2023-08-20 ENCOUNTER — Ambulatory Visit
Admission: RE | Admit: 2023-08-20 | Discharge: 2023-08-20 | Disposition: A | Discharge status: Home / Self Care | Payer: Medicaid Other | Source: Ambulatory Visit | Attending: Physician Assistant | Admitting: Physician Assistant

## 2023-08-20 VITALS — BP 102/66 | HR 64 | Temp 97.8°F | Resp 16 | Ht 64.25 in | Wt 165.0 lb

## 2023-08-20 DIAGNOSIS — N3000 Acute cystitis without hematuria: Secondary | ICD-10-CM | POA: Insufficient documentation

## 2023-08-20 LAB — POCT URINALYSIS DIP (MANUAL ENTRY)
Bilirubin, UA: NEGATIVE
Glucose, UA: NEGATIVE mg/dL
Ketones, POC UA: NEGATIVE mg/dL
Nitrite, UA: NEGATIVE
Protein Ur, POC: NEGATIVE mg/dL
Spec Grav, UA: 1.01 (ref 1.010–1.025)
Urobilinogen, UA: 0.2 U/dL
pH, UA: 6.5 (ref 5.0–8.0)

## 2023-08-20 MED ORDER — NITROFURANTOIN MONOHYD MACRO 100 MG PO CAPS: 100.0000 mg | ORAL_CAPSULE | Freq: Two times a day (BID) | ORAL | 0 refills | Status: DC

## 2023-08-20 NOTE — ED Triage Notes (Signed)
I feel like I have a UTI - Entered by patient  "Symptoms began on Tuesday with burning with urination, frequency with urination and last night aches and pains in flank (right/left) areas". No fever.

## 2023-08-22 LAB — URINE CULTURE: Culture: >=100000 — AB

## 2023-08-23 ENCOUNTER — Ambulatory Visit: Payer: Medicaid Other | Admitting: Dermatology

## 2023-08-23 ENCOUNTER — Encounter: Payer: Self-pay | Admitting: Dermatology

## 2023-08-23 DIAGNOSIS — L578 Other skin changes due to chronic exposure to nonionizing radiation: Secondary | ICD-10-CM | POA: Diagnosis not present

## 2023-08-23 DIAGNOSIS — D229 Melanocytic nevi, unspecified: Secondary | ICD-10-CM | POA: Diagnosis not present

## 2023-08-23 DIAGNOSIS — D1801 Hemangioma of skin and subcutaneous tissue: Secondary | ICD-10-CM

## 2023-08-23 DIAGNOSIS — Z1283 Encounter for screening for malignant neoplasm of skin: Secondary | ICD-10-CM

## 2023-08-23 DIAGNOSIS — L814 Other melanin hyperpigmentation: Secondary | ICD-10-CM

## 2023-08-23 NOTE — Progress Notes (Signed)
   Total Body Skin Exam (TBSE) Visit   Subjective  Betty Chung is a 32 y.o. female who presents for the following: Skin Cancer Screening and Full Body Skin Exam  Patient presents today for follow up visit for TBSE. Patient was last evaluated on 06/30/23 and 2 biopsies were performed on the neck. Patient denies medication changes. Patient reports she does have spots, moles and lesions of concern to be evaluated. Patient reports throughout her lifetime she has had complete sun exposure. Currently, patient reports if she has excessive sun exposure, she does apply sunscreen and/or wears protective coverings. Patient reports she has hx of bx. Patient reports  family history of skin cancers but is unsure if the results were positive for any findings. The patient has spots, moles and lesions to be evaluated, some may be new or changing and the patient has concerns that these could be cancer.  The following portions of the chart were reviewed this encounter and updated as appropriate: medications, allergies, medical history  Review of Systems:  No other skin or systemic complaints except as noted in HPI or Assessment and Plan.  Objective  Well appearing patient in no apparent distress; mood and affect are within normal limits.  A full examination was performed including scalp, head, eyes, ears, nose, lips, neck, chest, axillae, abdomen, back, buttocks, bilateral upper extremities, bilateral lower extremities, hands, feet, fingers, toes, fingernails, and toenails. All findings within normal limits unless otherwise noted below.   Relevant physical exam findings are noted in the Assessment and Plan.    Assessment & Plan   LENTIGINES, SEBORRHEIC KERATOSES, HEMANGIOMAS - Benign normal skin lesions - Benign-appearing - Call for any changes  BENIGN MELANOCYTIC NEVI - Tan-brown and/or pink-flesh-colored symmetric macules and papules - Benign appearing on exam today - Observation - Call clinic for  new or changing moles - Recommend daily use of broad spectrum spf 30+ sunscreen to sun-exposed areas.   MILD ACTINIC DAMAGE - Chronic condition, secondary to cumulative UV/sun exposure - diffuse scaly erythematous macules with underlying dyspigmentation - Recommend daily broad spectrum sunscreen SPF 30+ to sun-exposed areas, reapply every 2 hours as needed.  - Staying in the shade or wearing long sleeves, sun glasses (UVA+UVB protection) and wide brim hats (4-inch brim around the entire circumference of the hat) are also recommended for sun protection.  - Call for new or changing lesions.  SKIN CANCER SCREENING PERFORMED TODAY.    Skin exam for malignant neoplasm  Multiple benign melanocytic nevi  Actinic skin damage  Lentigines  Cherry angioma   Return in about 2 years (around 08/22/2025) for TBSE, come sooner if any issues develop.    Documentation: I have reviewed the above documentation for accuracy and completeness, and I agree with the above.  I, Shirron Marcha Solders, CMA, am acting as scribe for [employer], DO.   Langston Reusing, DO

## 2023-08-23 NOTE — Patient Instructions (Addendum)
Skin Education :  Wynelle Link screen (SPF 30 or greater) should be applied during peak UV exposure (between 10am and 2pm) and reapplied after exercise or swimming.  The ABCDEs of melanoma were reviewed with the patient, and the importance of monthly self-examination of moles was emphasized. Should any moles change in shape or color, or itch, bleed or burn, pt will contact our office for evaluation sooner then their interval appointment.  Sunscreen Recommendations I recommended a broad spectrum sunscreen with a SPF of 30 or higher. I explained that SPF 30 sunscreens block approximately 97 percent of the sun's harmful rays. Sunscreens should be applied at least 15 minutes prior to expected sun exposure and then every 2 hours after that as long as sun exposure continues. If swimming or exercising sunscreen should be reapplied every 45 minutes to an hour after getting wet or sweating.   One ounce, or the equivalent of a shot glass full of sunscreen, is adequate to protect the skin not covered by a bathing suit. I also recommended a lip balm with a sunscreen as well.   Sun protective clothing can be used in lieu of sunscreen but must be worn the entire time you are exposed to the sun's rays.     Important Information  Due to recent changes in healthcare laws, you may see results of your pathology and/or laboratory studies on MyChart before the doctors have had a chance to review them. We understand that in some cases there may be results that are confusing or concerning to you. Please understand that not all results are received at the same time and often the doctors may need to interpret multiple results in order to provide you with the best plan of care or course of treatment. Therefore, we ask that you please give [place] 2 business days to thoroughly review all your results before contacting the office for clarification. Should we see a critical lab result, you will be contacted sooner.   If You Need Anything  After Your Visit  If you have any questions or concerns for your doctor, please call our main line at [phone] If no one answers, please leave a voicemail as directed and we will return your call as soon as possible. Messages left after 4 pm will be answered the following business day.   You may also send [place] a message via MyChart. We typically respond to MyChart messages within 1-2 business days.  For prescription refills, please ask your pharmacy to contact our office. Our fax number is [phone].  If you have an urgent issue when the clinic is closed that cannot wait until the next business day, you can page your doctor at the number below.    Please note that while we do our best to be available for urgent issues outside of office hours, we are not available 24/7.   If you have an urgent issue and are unable to reach [place], you may choose to seek medical care at your doctor's office, retail clinic, urgent care center, or emergency room.  If you have a medical emergency, please immediately call 911 or go to the emergency department. In the event of inclement weather, please call our main line at [phone] for an update on the status of any delays or closures.  Dermatology Medication Tips: Please keep the boxes that topical medications come in in order to help keep track of the instructions about where and how to use these. Pharmacies typically print the medication instructions only on  the boxes and not directly on the medication tubes.   If your medication is too expensive, please contact our office at [phone] or send [place] a message through MyChart.   We are unable to tell what your co-pay for medications will be in advance as this is different depending on your insurance coverage. However, we may be able to find a substitute medication at lower cost or fill out paperwork to get insurance to cover a needed medication.   If a prior authorization is required to get your medication  covered by your insurance company, please allow [place] 1-2 business days to complete this process.  Drug prices often vary depending on where the prescription is filled and some pharmacies may offer cheaper prices.  The website www.goodrx.com contains coupons for medications through different pharmacies. The prices here do not account for what the cost may be with help from insurance (it may be cheaper with your insurance), but the website can give you the price if you did not use any insurance.  - You can print the associated coupon and take it with your prescription to the pharmacy.  - You may also stop by our office during regular business hours and pick up a GoodRx coupon card.  - If you need your prescription sent electronically to a different pharmacy, notify our office through [facility] or by phone at [phone]

## 2023-09-17 NOTE — ED Provider Notes (Signed)
[facility]    CSN: [id number] Arrival date & time: 08/20/23  1025      History   Chief Complaint Chief Complaint  Patient presents with   Urinary Frequency    Appt    HPI Betty Chung is a 32 y.o. female.   Patient here today for evaluation of dysuria and urinary frequency that started a few days ago.  She reports that last night she had some flank pain.  She denies any fever.  She denies any nausea or vomiting.  The history is provided by the patient.  Urinary Frequency Pertinent negatives include no abdominal pain.    Past Medical History:  Diagnosis Date   Anemia 03/26/2023   Anxiety    Depression    History of molar pregnancy, antepartum 05/2019   Hypothyroidism    Missed ab    Wears glasses     Patient Active Problem List   Diagnosis Date Noted   GAD (generalized anxiety disorder) 03/26/2023   Postpartum depression 03/26/2023   Multiple nevi 02/02/2023   Grade I hemorrhoids 02/02/2023   Hypothyroidism 04/06/2022   Antiphospholipid syndrome (HCC) 12/31/2020   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    Past Surgical History:  Procedure Laterality Date   DILATION AND EVACUATION N/A 06/06/2019   Procedure: DILATATION AND EVACUATION;  Surgeon: Philip Aspen, DO;  Location: [facility];  Service: Gynecology;  Laterality: N/A;   HEMORRHOID SURGERY N/A 06/21/2023   Procedure: INTERNAL HEMORRHOIDECTOMY;  Surgeon: Violeta Gelinas, MD;  Location: [facility];  Service: General;  Laterality: N/A;   HYSTEROSCOPY WITH D & C N/A 06/18/2020   Procedure: DILATATION AND EVACUATION;  Surgeon: Philip Aspen, DO;  Location: [facility];  Service: Gynecology;  Laterality: N/A;   KNEE SURGERY Right 2007   mass removed from under patella (per pt benign)   WISDOM TOOTH EXTRACTION  2011    OB History     Gravida  4   Para  1   Term  1   Preterm      AB  3   Living  1      SAB  1   IAB   2   Ectopic      Multiple  0   Live Births  1        Obstetric Comments  Last pap: 10/26/16 Hx of abnormal pap: yes, in 2017, repeat normal History of STIs: denies Age at menarche: 28          Home Medications    Prior to Admission medications   Medication Sig Start Date End Date Taking? Authorizing Provider  escitalopram (LEXAPRO) 10 MG tablet Take 1 tablet (10 mg total) by mouth daily. 04/30/23  Yes Melida Quitter, PA  levothyroxine (SYNTHROID) 25 MCG tablet Take 1 tablet (25 mcg total) by mouth daily. 08/05/23  Yes Melida Quitter, PA  nitrofurantoin, macrocrystal-monohydrate, (MACROBID) 100 MG capsule Take 1 capsule (100 mg total) by mouth 2 (two) times daily. 08/20/23  Yes Tomi Bamberger, PA-C  lidocaine (XYLOCAINE) 5 % ointment Apply 1 Application topically as needed. Patient not taking: Reported on 08/23/2023 03/19/23   Sandre Kitty, MD    Family History Family History  Problem Relation Age of Onset   Uterine cancer Paternal Grandmother    Lupus Maternal Uncle     Social History Social History   Tobacco Use   Smoking status: Never    Passive exposure: Never   Smokeless tobacco:  Never  Vaping Use   Vaping status: Never Used  Substance Use Topics   Alcohol use: Yes    Comment: Occassionally.   Drug use: Never     Allergies   Patient has no known allergies.   Review of Systems Review of Systems  Constitutional:  Negative for chills and fever.  Eyes:  Negative for discharge and redness.  Gastrointestinal:  Negative for abdominal pain, nausea and vomiting.  Genitourinary:  Positive for dysuria and frequency.     Physical Exam Triage Vital Signs ED Triage Vitals  Encounter Vitals Group     BP 08/20/23 1125 102/66     Systolic BP Percentile --      Diastolic BP Percentile --      Pulse Rate 08/20/23 1125 64     Resp 08/20/23 1125 16     Temp 08/20/23 1125 97.8 F (36.6 C)     Temp Source 08/20/23 1125 Oral     SpO2 08/20/23 1125 97  %     Weight 08/20/23 1123 165 lb (74.8 kg)     Height 08/20/23 1123 5' 4.25" (1.632 m)     Head Circumference --      Peak Flow --      Pain Score 08/20/23 1121 0     Pain Loc --      Pain Education --      Exclude from Growth Chart --    No data found.  Updated Vital Signs BP 102/66 (BP Location: Left Arm)   Pulse 64   Temp 97.8 F (36.6 C) (Oral)   Resp 16   Ht 5' 4.25" (1.632 m)   Wt 165 lb (74.8 kg)   LMP 07/26/2023 (Exact Date)   SpO2 97%   BMI 28.10 kg/m   Visual Acuity Right Eye Distance:   Left Eye Distance:   Bilateral Distance:    Right Eye Near:   Left Eye Near:    Bilateral Near:     Physical Exam Vitals and nursing note reviewed.  Constitutional:      General: She is not in acute distress.    Appearance: Normal appearance. She is not ill-appearing.  HENT:     Head: Normocephalic and atraumatic.  Eyes:     Conjunctiva/sclera: Conjunctivae normal.  Cardiovascular:     Rate and Rhythm: Normal rate.  Pulmonary:     Effort: Pulmonary effort is normal. No respiratory distress.  Neurological:     Mental Status: She is alert.  Psychiatric:        Mood and Affect: Mood normal.        Behavior: Behavior normal.        Thought Content: Thought content normal.      UC Treatments / Results  Labs (all labs ordered are listed, but only abnormal results are displayed) Labs Reviewed  URINE CULTURE - Abnormal; Notable for the following components:      Result Value   Culture >=100,000 COLONIES/mL ESCHERICHIA COLI (*)    Organism ID, Bacteria ESCHERICHIA COLI (*)    All other components within normal limits  POCT URINALYSIS DIP (MANUAL ENTRY) - Abnormal; Notable for the following components:   Blood, UA small (*)    Leukocytes, UA Trace (*)    All other components within normal limits    EKG   Radiology No results found.  Procedures Procedures (including critical care time)  Medications Ordered in UC Medications - No data to  display  Initial Impression / Assessment and  Plan / UC Course  I have reviewed the triage vital signs and the nursing notes.  Pertinent labs & imaging results that were available during my care of the patient were reviewed by me and considered in my medical decision making (see chart for details).    Will treat to cover UTI with Macrobid.  Urine culture ordered.  Recommended follow-up if no gradual improvement with any further concerns.  Final Clinical Impressions(s) / UC Diagnoses   Final diagnoses:  Acute cystitis without hematuria   Discharge Instructions   None    ED Prescriptions     Medication Sig Dispense Auth. Provider   nitrofurantoin, macrocrystal-monohydrate, (MACROBID) 100 MG capsule Take 1 capsule (100 mg total) by mouth 2 (two) times daily. 10 capsule Tomi Bamberger, PA-C      PDMP not reviewed this encounter.   Tomi Bamberger, PA-C 09/17/23 1105

## 2023-10-27 NOTE — L&D Delivery Note (Addendum)
 Delivery Note Betty Chung is a 33 y.o. G5 now P2032 who had a spontaneous delivery at [redacted]w[redacted]d after presenting in labor.  At 07:27am, a viable baby girl was delivered via  (Presentation: vertex; LOA  ).  APGAR: 6 , 9; weight 8lb15.6oz .     Admitted for labor, incidental AROM occurred with check. Progressed quickly. Used nitrous for pain management. Pushed for 19 minutes. Fetal head delivered and maternal effort stopped due to pain; shoulder was unable to be delivered with typical downward traction. I announced a shoulder dystocia. McRoberts and suprapubic pressure were performed. Then, I performed woods screw maneuver with some movement of baby, but still was not able to deliver. I reached for posterior arm, but was unable to grasp it. Then I performed Rubin maneuver and the patient was able to push for resolution of the shoulder dystocia approximately 45 seconds after the delivery of the infant's head. Infant was delivered to maternal abdomen. She was noted to be floppy and cord was immediately clamped and cut. Due to patient's history of postpartum hemorrhage, pitocin  bolus and TXA were given immediately after delivery. Delivery of placenta was spontaneous. Placenta was found to be intact, 3-vessel cord was noted. The fundus was found to be boggy, but firmed with uterine massage. 2nd degree perineal laceration was repaired under local anesthesia in the normal sterile fashion with 3-0 vicryl. Patient continued to have clots expelled with each fundal rub. She was given IM methergine  then buccal cytotec  with some improvement. She continued to have moderate flow of bleeding, so I called for a Jada. Patient was given 100mcg of fentanyl  and I performed a uterine sweep and placed the Jada on suction. Estimated blood loss approximately 900cc. Cord gases not sent. Instrument and gauze counts were correct at the end of the procedure. Fundus was firm, bleeding was minimal, flow was very slow in [place] tubing,  and both mother and baby were doing well when I left the room.   Placenta status: pathology for history of molar pregnancy. Cord:  3-vessel Anesthesia:  nitrous oxide, lidocaine , and fentanyl  Episiotomy:  no Lacerations:  2nd degree perineal Suture Repair: 3.0 vicryl rapide Est. Blood Loss (mL):  approximately 900cc  Mom to postpartum.  Baby to Couplet care / Skin to Skin.  Betty Chung A Betty Chung 05/14/2024, 8:40 AM  Addendum: Performed delivery debrief with patient and her husband, all questions answered.   Checked in on [name] approximately 2 hours after delivery. She was sitting up in bed, well appearing. Bleeding in Jada tubing had not reached the canister. Patient reports feeling well but sore.

## 2023-12-02 ENCOUNTER — Encounter: Payer: Self-pay | Admitting: Family Medicine

## 2023-12-02 ENCOUNTER — Ambulatory Visit
Admission: RE | Admit: 2023-12-02 | Discharge: 2023-12-02 | Disposition: A | Discharge status: Home / Self Care | Payer: Medicaid Other | Source: Ambulatory Visit

## 2023-12-02 VITALS — BP 105/66 | HR 107 | Temp 98.3°F | Resp 20

## 2023-12-02 DIAGNOSIS — J209 Acute bronchitis, unspecified: Secondary | ICD-10-CM

## 2023-12-02 DIAGNOSIS — J40 Bronchitis, not specified as acute or chronic: Secondary | ICD-10-CM | POA: Diagnosis not present

## 2023-12-02 DIAGNOSIS — Z3A15 15 weeks gestation of pregnancy: Secondary | ICD-10-CM | POA: Diagnosis not present

## 2023-12-02 DIAGNOSIS — J329 Chronic sinusitis, unspecified: Secondary | ICD-10-CM

## 2023-12-02 DIAGNOSIS — O99512 Diseases of the respiratory system complicating pregnancy, second trimester: Secondary | ICD-10-CM

## 2023-12-02 MED ORDER — ALBUTEROL SULFATE HFA 108 (90 BASE) MCG/ACT IN AERS: 1.0000 | INHALATION_SPRAY | Freq: Four times a day (QID) | RESPIRATORY_TRACT | 0 refills | Status: DC | PRN

## 2023-12-02 MED ORDER — AMOXICILLIN-POT CLAVULANATE 875-125 MG PO TABS: 1.0000 | ORAL_TABLET | Freq: Two times a day (BID) | ORAL | 0 refills | Status: DC

## 2023-12-02 MED ORDER — ALBUTEROL SULFATE (2.5 MG/3ML) 0.083% IN NEBU
2.5000 mg | INHALATION_SOLUTION | Freq: Once | RESPIRATORY_TRACT | Status: AC
  Administered 2023-12-02: 2.5 mg via RESPIRATORY_TRACT

## 2023-12-02 NOTE — ED Triage Notes (Signed)
 Pt c/o cough, congestion, sob for 2 weeks.  PT is pregnant.

## 2023-12-02 NOTE — ED Provider Notes (Signed)
 [facility]    CSN: [id number] Arrival date & time: 12/02/23  1030      History   Chief Complaint Chief Complaint  Patient presents with   Cough    Been sick for a few days with a bad cough, runny and stuffy nose, body aches, shortness of breath. I'm nervous because I'm pregnant and I can't seem to shake it - Entered by patient    HPI Betty Chung is a 33 y.o. female.   Betty Chung is a 33 y.o. female presenting for chief complaint of cough, nasal congestion, and intermittent shortness of breath associated with coughing that started approximately 2 to 3 weeks ago.  She is currently [redacted] weeks pregnant and has been using over-the-counter medications without relief.  States cough worsened significantly yesterday and she was unable to take a deep breath without having a coughing fit.  She additionally reports green/yellow nasal congestion and mucus with sinus pressure.  No recent fevers, chills, nausea, vomiting, diarrhea, abdominal pain, vaginal bleeding, urinary symptoms, or flank pain.  Denies history of chronic respiratory problems.  Never smoker.  Denies recent antibiotic use.   Cough   Past Medical History:  Diagnosis Date   Anemia 03/26/2023   Anxiety    Depression    History of molar pregnancy, antepartum 05/2019   Hypothyroidism    Missed ab    Wears glasses     Patient Active Problem List   Diagnosis Date Noted   GAD (generalized anxiety disorder) 03/26/2023   Postpartum depression 03/26/2023   Multiple nevi 02/02/2023   Grade I hemorrhoids 02/02/2023   Hypothyroidism 04/06/2022   Antiphospholipid syndrome (HCC) 12/31/2020   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    Past Surgical History:  Procedure Laterality Date   DILATION AND EVACUATION N/A 06/06/2019   Procedure: DILATATION AND EVACUATION;  Surgeon: Lilton Legions, DO;  Location: [facility];  Service: Gynecology;  Laterality: N/A;   HEMORRHOID SURGERY N/A  06/21/2023   Procedure: INTERNAL HEMORRHOIDECTOMY;  Surgeon: Sebastian Moles, MD;  Location: [facility];  Service: General;  Laterality: N/A;   HYSTEROSCOPY WITH D & C N/A 06/18/2020   Procedure: DILATATION AND EVACUATION;  Surgeon: Lilton Legions, DO;  Location: [facility];  Service: Gynecology;  Laterality: N/A;   KNEE SURGERY Right 2007   mass removed from under patella (per pt benign)   WISDOM TOOTH EXTRACTION  2011    OB History     Gravida  5   Para  1   Term  1   Preterm      AB  3   Living  1      SAB  1   IAB  2   Ectopic      Multiple  0   Live Births  1        Obstetric Comments  Last pap: 10/26/16 Hx of abnormal pap: yes, in 2017, repeat normal History of STIs: denies Age at menarche: 42          Home Medications    Prior to Admission medications   Medication Sig Start Date End Date Taking? Authorizing Provider  albuterol  (VENTOLIN  HFA) 108 (90 Base) MCG/ACT inhaler Inhale 1-2 puffs into the lungs every 6 (six) hours as needed for wheezing or shortness of breath. 12/02/23  Yes Enedelia Dorna HERO, FNP  amoxicillin -clavulanate (AUGMENTIN ) 875-125 MG tablet Take 1 tablet by mouth every 12 (twelve) hours. 12/02/23  Yes Enedelia Dorna HERO, FNP  enoxaparin  (  LOVENOX ) 40 MG/0.4ML injection Inject 40 mg into the skin daily. 11/15/23  Yes [provider]  escitalopram  (LEXAPRO ) 10 MG tablet Take 1 tablet (10 mg total) by mouth daily. 04/30/23   Wallace Joesph LABOR, PA  levothyroxine  (SYNTHROID ) 25 MCG tablet Take 1 tablet (25 mcg total) by mouth daily. 08/05/23   Wallace Joesph LABOR, PA  lidocaine  (XYLOCAINE ) 5 % ointment Apply 1 Application topically as needed. Patient not taking: Reported on 08/23/2023 03/19/23   Chandra Toribio POUR, MD  nitrofurantoin , macrocrystal-monohydrate, (MACROBID ) 100 MG capsule Take 1 capsule (100 mg total) by mouth 2 (two) times daily. Patient not taking: Reported on 12/02/2023 08/20/23   Billy Asberry FALCON, PA-C    Family History Family History  Problem Relation Age of Onset   Uterine cancer Paternal Grandmother    Lupus Maternal Uncle     Social History Social History   Tobacco Use   Smoking status: Never    Passive exposure: Never   Smokeless tobacco: Never  Vaping Use   Vaping status: Never Used  Substance Use Topics   Alcohol use: Yes    Comment: Occassionally.   Drug use: Never     Allergies   Patient has no known allergies.   Review of Systems Review of Systems  Respiratory:  Positive for cough.   Per HPI   Physical Exam Triage Vital Signs ED Triage Vitals  Encounter Vitals Group     BP 12/02/23 1048 105/66     Systolic BP Percentile --      Diastolic BP Percentile --      Pulse Rate 12/02/23 1048 (!) 107     Resp 12/02/23 1048 20     Temp 12/02/23 1048 98.3 F (36.8 C)     Temp Source 12/02/23 1048 Oral     SpO2 12/02/23 1048 97 %     Weight --      Height --      Head Circumference --      Peak Flow --      Pain Score 12/02/23 1106 2     Pain Loc --      Pain Education --      Exclude from Growth Chart --    No data found.  Updated Vital Signs BP 105/66 (BP Location: Right Arm)   Pulse (!) 107   Temp 98.3 F (36.8 C) (Oral)   Resp 20   LMP 07/26/2023 (Exact Date)   SpO2 97%   Visual Acuity Right Eye Distance:   Left Eye Distance:   Bilateral Distance:    Right Eye Near:   Left Eye Near:    Bilateral Near:     Physical Exam Vitals and nursing note reviewed.  Constitutional:      Appearance: She is not ill-appearing or toxic-appearing.  HENT:     Head: Normocephalic and atraumatic.     Right Ear: Hearing and external ear normal.     Left Ear: Hearing and external ear normal.     Nose: Nose normal.     Mouth/Throat:     Lips: Pink.  Eyes:     General: Lids are normal. Vision grossly intact. Gaze aligned appropriately.     Extraocular Movements: Extraocular movements intact.     Conjunctiva/sclera: Conjunctivae  normal.  Pulmonary:     Effort: Pulmonary effort is normal. No respiratory distress.     Breath sounds: No stridor. No wheezing, rhonchi or rales.     Comments: Course breath sounds throughout  with harsh cough on exam. Speaking in full sentences without difficulty. Faint expiratory wheeze heard with cough.  Chest:     Chest wall: No tenderness.  Musculoskeletal:     Cervical back: Neck supple.  Skin:    General: Skin is warm and dry.     Capillary Refill: Capillary refill takes less than 2 seconds.     Findings: No rash.  Neurological:     General: No focal deficit present.     Mental Status: She is alert and oriented to person, place, and time. Mental status is at baseline.     Cranial Nerves: No dysarthria or facial asymmetry.  Psychiatric:        Mood and Affect: Mood normal.        Speech: Speech normal.        Behavior: Behavior normal.        Thought Content: Thought content normal.        Judgment: Judgment normal.      UC Treatments / Results  Labs (all labs ordered are listed, but only abnormal results are displayed) Labs Reviewed - No data to display  EKG   Radiology No results found.  Procedures Procedures (including critical care time)  Medications Ordered in UC Medications  albuterol  (PROVENTIL ) (2.5 MG/3ML) 0.083% nebulizer solution 2.5 mg (2.5 mg Nebulization Given 12/02/23 1135)    Initial Impression / Assessment and Plan / UC Course  I have reviewed the triage vital signs and the nursing notes.  Pertinent labs & imaging results that were available during my care of the patient were reviewed by me and considered in my medical decision making (see chart for details).   1.  Sinobronchitis, [redacted] weeks gestation of pregnancy, diseases of respiratory system complicating pregnancy second trimester Evaluation suggests sinobronchitis etiology.  Patient is not a candidate for imaging given pregnancy status though I have low suspicion for acute cardiopulmonary  abnormality/pneumonia given hemodynamically stable vital signs and lung sounds.   Symptoms have been present for greater than 10 days, therefore will treat with Augmentin  antibiotic as prescribed.  Albuterol  inhaler as needed.  Prescriptions sent for further symptomatic relief, may continue OTC medications as needed.  List of medications safe in pregnancy provided.  Counseled patient on potential for adverse effects with medications prescribed/recommended today, strict ER and return-to-clinic precautions discussed, patient verbalized understanding.    Final Clinical Impressions(s) / UC Diagnoses   Final diagnoses:  Diseases of the respiratory system complicating pregnancy, second trimester  [redacted] weeks gestation of pregnancy  Sinobronchitis     Discharge Instructions      Your evaluation shows you have a bacterial sinus infection. - Take antibiotic sent to pharmacy as directed to treat sinus infection. - Cough medicines as needed. - Warm compresses to the cheeks and forehead as needed to help with sinus headaches as well as tylenol  as needed.  If you develop any new or worsening symptoms or if your symptoms do not start to improve, please return here or follow-up with your primary care provider. If your symptoms are severe, please go to the emergency room.      ED Prescriptions     Medication Sig Dispense Auth. Provider   amoxicillin -clavulanate (AUGMENTIN ) 875-125 MG tablet Take 1 tablet by mouth every 12 (twelve) hours. 14 tablet Enedelia Dorna HERO, FNP   albuterol  (VENTOLIN  HFA) 108 (90 Base) MCG/ACT inhaler Inhale 1-2 puffs into the lungs every 6 (six) hours as needed for wheezing or shortness of breath. 8 g  Enedelia Dorna HERO, FNP      PDMP not reviewed this encounter.   Enedelia Dorna HERO, [place] 12/02/23 1157

## 2023-12-02 NOTE — Discharge Instructions (Signed)
 Your evaluation shows you have a bacterial sinus infection. - Take antibiotic sent to pharmacy as directed to treat sinus infection. - Cough medicines as needed. - Warm compresses to the cheeks and forehead as needed to help with sinus headaches as well as tylenol  as needed.  If you develop any new or worsening symptoms or if your symptoms do not start to improve, please return here or follow-up with your primary care provider. If your symptoms are severe, please go to the emergency room.

## 2024-02-01 ENCOUNTER — Other Ambulatory Visit: Payer: Medicaid Other

## 2024-02-01 DIAGNOSIS — E039 Hypothyroidism, unspecified: Secondary | ICD-10-CM

## 2024-02-02 LAB — T4, FREE: Free T4: 0.86 ng/dL (ref 0.82–1.77)

## 2024-02-02 LAB — TSH: TSH: 2.52 u[IU]/mL (ref 0.450–4.500)

## 2024-02-08 ENCOUNTER — Ambulatory Visit: Payer: Medicaid Other | Admitting: Family Medicine

## 2024-02-16 ENCOUNTER — Ambulatory Visit: Admitting: Family Medicine

## 2024-02-16 ENCOUNTER — Encounter: Payer: Self-pay | Admitting: Family Medicine

## 2024-02-16 VITALS — BP 108/69 | HR 85 | Ht 64.25 in | Wt 176.0 lb

## 2024-02-16 DIAGNOSIS — G2581 Restless legs syndrome: Secondary | ICD-10-CM

## 2024-02-16 DIAGNOSIS — E039 Hypothyroidism, unspecified: Secondary | ICD-10-CM

## 2024-02-16 NOTE — Assessment & Plan Note (Signed)
 Patient's OB/GYN recently increased it to 50 mcg.  Will let them manage her hypothyroidism during pregnancy.  Follow-up after pregnancy and recheck TSH at that time.

## 2024-02-16 NOTE — Patient Instructions (Signed)
 It was nice to see you today,  We addressed the following topics today: Congratulations on your pregnancy.  I will let the OB/GYN manage your thyroid  medication during your pregnancy since she will see them much more often than me. - For restless leg or leg cramps you can try the following: Magnesium oxide 400 mg, leg massage, heat like with a heating pad.  These are all safe during pregnancy.  Have a great day,  Etha Henle, MD

## 2024-02-16 NOTE — Progress Notes (Signed)
   Established Patient Office Visit  Subjective   Patient ID: Betty Chung, female    DOB: 1990/11/09  Age: 33 y.o. 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 - Currently on Lexapro  - OB recommends continuing through pregnancy - Has 57-34 month old child   The ASCVD Risk score (Arnett DK, et al., 2019) failed to calculate for the following reasons:   The 2019 ASCVD risk score is only valid for ages 73 to 37  Health Maintenance Due  Topic Date Due   COVID-19 Vaccine (1) Never done      Objective:     BP 108/69   Pulse 85   Ht 5' 4.25" (1.632 m)   Wt 176 lb (79.8 kg)   LMP 07/26/2023 (Exact Date)   SpO2 99%   BMI 29.98 kg/m    Physical Exam General: Alert, oriented Pulmonary: No respiratory distress  Psych: pleasant affect   No results found for any visits on 02/16/24.      Assessment & Plan:   Acquired hypothyroidism Assessment & Plan: Patient's OB/GYN recently increased it to 50 mcg.  Will let them manage her hypothyroidism during pregnancy.  Follow-up after pregnancy and recheck TSH at that time.   Restless leg syndrome Assessment & Plan: Currently pregnant.  Recommended magnesium supplement, massage, topical heat as needed.      Return in about 4 months (around 06/17/2024) for Thyroid .    Laneta Pintos, MD

## 2024-02-16 NOTE — Assessment & Plan Note (Signed)
 Currently pregnant.  Recommended magnesium supplement, massage, topical heat as needed.

## 2024-03-14 ENCOUNTER — Non-Acute Institutional Stay (HOSPITAL_COMMUNITY)
Admission: RE | Admit: 2024-03-14 | Discharge: 2024-03-14 | Disposition: A | Discharge status: Home / Self Care | Source: Ambulatory Visit | Attending: Internal Medicine | Admitting: Internal Medicine

## 2024-03-14 DIAGNOSIS — O99019 Anemia complicating pregnancy, unspecified trimester: Secondary | ICD-10-CM | POA: Insufficient documentation

## 2024-03-14 MED ORDER — SODIUM CHLORIDE 0.9 % IV SOLN: INTRAVENOUS | Status: DC | PRN

## 2024-03-14 MED ORDER — SODIUM CHLORIDE 0.9 % IV SOLN
510.0000 mg | Freq: Once | INTRAVENOUS | Status: AC
  Administered 2024-03-14: 510 mg via INTRAVENOUS
  Filled 2024-03-14: qty 17

## 2024-03-14 NOTE — Progress Notes (Signed)
 PATIENT CARE CENTER NOTE  Diagnosis: ICD-10: 099.019: Anemia complicating pregnancy, unspecified trimester   Provider: Felicity Horseman, MD  Procedure: Feraheme 510 mg (#1 of 2)  Note: Patient received Feraheme 510 mg via PIV. No pre-medications per orders. Pt tolerated the infusion with no adverse reaction. Pt observed for 30 minutes post infusion. Vital signs are stable . Pt advised that per orders she should return in one week for second dose, and can schedule her next appointment at the front desk. AVS printed and given to patient. Pt is alert, oriented, and ambulatory at discharge.

## 2024-03-23 ENCOUNTER — Non-Acute Institutional Stay (HOSPITAL_COMMUNITY)
Admission: RE | Admit: 2024-03-23 | Discharge: 2024-03-23 | Disposition: A | Discharge status: Home / Self Care | Source: Ambulatory Visit | Attending: Internal Medicine | Admitting: Internal Medicine

## 2024-03-23 DIAGNOSIS — O99019 Anemia complicating pregnancy, unspecified trimester: Secondary | ICD-10-CM | POA: Insufficient documentation

## 2024-03-23 MED ORDER — SODIUM CHLORIDE 0.9 % IV SOLN: INTRAVENOUS | Status: DC | PRN

## 2024-03-23 MED ORDER — SODIUM CHLORIDE 0.9 % IV SOLN
510.0000 mg | Freq: Once | INTRAVENOUS | Status: AC
  Administered 2024-03-23: 510 mg via INTRAVENOUS
  Filled 2024-03-23: qty 17

## 2024-03-23 NOTE — Progress Notes (Signed)
 Patient Care Center Note   Diagnosis: ICD-10: 099.019: Anemia complicating pregnancy, unspecified trimester   Provider: Felicity Horseman, MD   Procedure: Feraheme 510 mg (#2 of 2)    Note: Pt received Feraheme 510mg  via PIV with no premedication orders. Vitals remained stable before and after transfusion. Pt tolerated infusion well and was observed for 30 minutes post infusion. Pt requested AVS, printed and given to the patient. PIV removed intact. Pt is alert, oriented, and ambulatory at discharge.

## 2024-04-03 DIAGNOSIS — O35EXX0 Maternal care for other (suspected) fetal abnormality and damage, fetal genitourinary anomalies, not applicable or unspecified: Secondary | ICD-10-CM | POA: Insufficient documentation

## 2024-04-11 ENCOUNTER — Other Ambulatory Visit: Payer: Self-pay | Admitting: Obstetrics and Gynecology

## 2024-04-11 DIAGNOSIS — Z363 Encounter for antenatal screening for malformations: Secondary | ICD-10-CM

## 2024-04-13 ENCOUNTER — Ambulatory Visit: Admitting: Obstetrics and Gynecology

## 2024-04-13 ENCOUNTER — Ambulatory Visit: Attending: Obstetrics and Gynecology

## 2024-04-13 VITALS — BP 104/62 | HR 79

## 2024-04-13 DIAGNOSIS — O99119 Other diseases of the blood and blood-forming organs and certain disorders involving the immune mechanism complicating pregnancy, unspecified trimester: Secondary | ICD-10-CM | POA: Insufficient documentation

## 2024-04-13 DIAGNOSIS — D6861 Antiphospholipid syndrome: Secondary | ICD-10-CM | POA: Diagnosis not present

## 2024-04-13 DIAGNOSIS — Z3A33 33 weeks gestation of pregnancy: Secondary | ICD-10-CM

## 2024-04-13 DIAGNOSIS — O35EXX0 Maternal care for other (suspected) fetal abnormality and damage, fetal genitourinary anomalies, not applicable or unspecified: Secondary | ICD-10-CM

## 2024-04-13 DIAGNOSIS — O99283 Endocrine, nutritional and metabolic diseases complicating pregnancy, third trimester: Secondary | ICD-10-CM | POA: Diagnosis not present

## 2024-04-13 DIAGNOSIS — O99113 Other diseases of the blood and blood-forming organs and certain disorders involving the immune mechanism complicating pregnancy, third trimester: Secondary | ICD-10-CM

## 2024-04-13 DIAGNOSIS — Z363 Encounter for antenatal screening for malformations: Secondary | ICD-10-CM

## 2024-04-13 DIAGNOSIS — E079 Disorder of thyroid, unspecified: Secondary | ICD-10-CM

## 2024-04-13 DIAGNOSIS — E039 Hypothyroidism, unspecified: Secondary | ICD-10-CM

## 2024-04-13 NOTE — Progress Notes (Signed)
 Maternal-Fetal Medicine Consultation Name: Betty Chung MRN: [id number]  G5 P1031 at 33w 5d gestation.  Patient is here for a second opinion.  At your office ultrasound, left kidney appeared abnormal.  Obstetric history significant for a term vaginal delivery in January 2024 of a female infant weighing 8 pounds and 2 ounces at birth.  She had 3 early miscarriages for that pregnancy. On evaluation, patient had increased antiphospholipid antibodies and the diagnosis of antiphospholipid syndrome (APS) was made. She takes Lovenox  prophylaxis 40 mg subcutaneously daily. Medications: Citalopram 10 mg daily.  Ultrasound Amniotic fluid is normal and good fetal activity seen. Fetal growth is appropriate for gestational age.  Abdominal circumference measurement is at the 99th percentile.  Head measurements were difficult to obtain because of position. 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  Right kidney appears normally located and did not have abnormalities.  Fetal bladder appears normal.  Our concerns include Left pelvic kidney I explained the diagnosis with help of ultrasound images and diagrams. Pelvic kidney refers to an abnormally-located kidney. It is present in about 1 in 700 to 1 in 1,000 live births with a slight female predominance.  It can be associated with other anomalies. I reassured the patient that rest of the fetal anatomy appears normal. Pelvic kidney is usually smaller and can be associated with renal obstruction and hydronephrosis, renal calculi and postnatal surgery. I reassured her that in the presence of normal-functioning contralateral kidney, no early delivery is indicated. Patient can safely attempt vaginal delivery. In the absence of associated anomalies, the risk of chromosomal anomaly is very rare. I did not recommend amniocentesis for this isolated finding.  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   I do not have her laboratory evidence of APS.  She does not have personal history of venous thromboembolism.  She had taken oral contraceptives in the past without any side effects.  I discussed weekly antenatal testing (NST or BPP from now till delivery. Patient reports she will be undergoing induction of labor at [redacted] weeks gestation. Unfractionated heparin 10,000 units twice daily should be considered at [redacted] weeks gestation or closer to induction.  Postpartum Lovenox  should be continued for 6 weeks.  Citalopram (Lexapro ) Lexapro  (Escitalopram ): No increased fetal congenital malformations have been reported with the use of this drug in early pregnancy. A case-controlled study suggested the association of primary pulmonary hypertension (PPHN). However, the absolute risk is very small (less than 1%).   Patient was concerned about the abdominal circumference measurements.  I explained that ultrasound has limitations in accurately estimating fetal weights.  She is 5 feet 4 inches tall and had normal vaginal delivery.  I reassured the patient that the likelihood of successful vaginal delivery is very high.  Recommendations - Weekly NST or BPP at your office. Consultation including face-to-face (more than 50%) counseling 45 minutes.

## 2024-05-12 ENCOUNTER — Other Ambulatory Visit: Payer: Self-pay | Admitting: Obstetrics

## 2024-05-14 ENCOUNTER — Other Ambulatory Visit: Payer: Self-pay

## 2024-05-14 ENCOUNTER — Inpatient Hospital Stay (HOSPITAL_COMMUNITY)
Admission: AD | Admit: 2024-05-14 | Discharge: 2024-05-16 | DRG: 768 | Disposition: A | Discharge status: Home / Self Care | Attending: Obstetrics and Gynecology | Admitting: Obstetrics and Gynecology

## 2024-05-14 ENCOUNTER — Encounter (HOSPITAL_COMMUNITY): Payer: Self-pay | Admitting: Anesthesiology

## 2024-05-14 ENCOUNTER — Encounter (HOSPITAL_COMMUNITY): Payer: Self-pay | Admitting: Obstetrics and Gynecology

## 2024-05-14 DIAGNOSIS — O9912 Other diseases of the blood and blood-forming organs and certain disorders involving the immune mechanism complicating childbirth: Secondary | ICD-10-CM | POA: Diagnosis present

## 2024-05-14 DIAGNOSIS — D6861 Antiphospholipid syndrome: Secondary | ICD-10-CM | POA: Diagnosis present

## 2024-05-14 DIAGNOSIS — O43193 Other malformation of placenta, third trimester: Secondary | ICD-10-CM | POA: Diagnosis present

## 2024-05-14 DIAGNOSIS — F419 Anxiety disorder, unspecified: Secondary | ICD-10-CM | POA: Diagnosis present

## 2024-05-14 DIAGNOSIS — O99284 Endocrine, nutritional and metabolic diseases complicating childbirth: Secondary | ICD-10-CM | POA: Diagnosis present

## 2024-05-14 DIAGNOSIS — O403XX0 Polyhydramnios, third trimester, not applicable or unspecified: Secondary | ICD-10-CM | POA: Diagnosis present

## 2024-05-14 DIAGNOSIS — E039 Hypothyroidism, unspecified: Secondary | ICD-10-CM | POA: Diagnosis present

## 2024-05-14 DIAGNOSIS — O99344 Other mental disorders complicating childbirth: Secondary | ICD-10-CM | POA: Diagnosis present

## 2024-05-14 DIAGNOSIS — Z3A39 39 weeks gestation of pregnancy: Secondary | ICD-10-CM

## 2024-05-14 DIAGNOSIS — O3663X0 Maternal care for excessive fetal growth, third trimester, not applicable or unspecified: Secondary | ICD-10-CM | POA: Diagnosis present

## 2024-05-14 DIAGNOSIS — O26893 Other specified pregnancy related conditions, third trimester: Secondary | ICD-10-CM | POA: Diagnosis present

## 2024-05-14 DIAGNOSIS — O35EXX0 Maternal care for other (suspected) fetal abnormality and damage, fetal genitourinary anomalies, not applicable or unspecified: Secondary | ICD-10-CM | POA: Diagnosis present

## 2024-05-14 LAB — CBC
HCT: 36.6 % (ref 36.0–46.0)
Hemoglobin: 12.2 g/dL (ref 12.0–15.0)
MCH: 28.1 pg (ref 26.0–34.0)
MCHC: 33.3 g/dL (ref 30.0–36.0)
MCV: 84.3 fL (ref 80.0–100.0)
Platelets: 164 K/uL (ref 150–400)
RBC: 4.34 MIL/uL (ref 3.87–5.11)
RDW: 16.8 % — ABNORMAL HIGH (ref 11.5–15.5)
WBC: 8.1 K/uL (ref 4.0–10.5)
nRBC: 0 % (ref 0.0–0.2)

## 2024-05-14 LAB — TYPE AND SCREEN
ABO/RH(D): A POS
Antibody Screen: NEGATIVE

## 2024-05-14 LAB — RPR: RPR Ser Ql: NONREACTIVE

## 2024-05-14 MED ORDER — FENTANYL-BUPIVACAINE-NACL 0.5-0.125-0.9 MG/250ML-% EP SOLN
12.0000 mL/h | EPIDURAL | Status: DC | PRN
  Filled 2024-05-14: qty 250

## 2024-05-14 MED ORDER — FENTANYL CITRATE (PF) 100 MCG/2ML IJ SOLN
100.0000 ug | Freq: Once | INTRAMUSCULAR | Status: AC
  Administered 2024-05-14: 100 ug via INTRAVENOUS

## 2024-05-14 MED ORDER — PRENATAL MULTIVITAMIN CH
1.0000 | ORAL_TABLET | Freq: Every day | ORAL | Status: DC
  Administered 2024-05-15 – 2024-05-16 (×2): 1 via ORAL
  Filled 2024-05-14 (×2): qty 1

## 2024-05-14 MED ORDER — LACTATED RINGERS IV SOLN: 500.0000 mL | INTRAVENOUS | Status: DC | PRN

## 2024-05-14 MED ORDER — ESCITALOPRAM OXALATE 10 MG PO TABS
10.0000 mg | ORAL_TABLET | Freq: Every day | ORAL | Status: DC
  Administered 2024-05-14 – 2024-05-16 (×3): 10 mg via ORAL
  Filled 2024-05-14 (×3): qty 1

## 2024-05-14 MED ORDER — METHYLERGONOVINE MALEATE 0.2 MG PO TABS: 0.2000 mg | ORAL_TABLET | ORAL | Status: DC | PRN

## 2024-05-14 MED ORDER — BENZOCAINE-MENTHOL 20-0.5 % EX AERO
1.0000 | INHALATION_SPRAY | CUTANEOUS | Status: DC | PRN
Start: 2024-05-14 — End: 2024-05-16
  Administered 2024-05-15: 1 via TOPICAL
  Filled 2024-05-14: qty 56

## 2024-05-14 MED ORDER — DIBUCAINE (PERIANAL) 1 % EX OINT: 1.0000 | TOPICAL_OINTMENT | CUTANEOUS | Status: DC | PRN

## 2024-05-14 MED ORDER — LEVOTHYROXINE SODIUM 50 MCG PO TABS
50.0000 ug | ORAL_TABLET | Freq: Every day | ORAL | Status: DC
  Administered 2024-05-14 – 2024-05-15 (×2): 50 ug via ORAL
  Filled 2024-05-14 (×2): qty 1

## 2024-05-14 MED ORDER — ONDANSETRON HCL 4 MG/2ML IJ SOLN: 4.0000 mg | Freq: Four times a day (QID) | INTRAMUSCULAR | Status: DC | PRN

## 2024-05-14 MED ORDER — DIPHENHYDRAMINE HCL 25 MG PO CAPS
25.0000 mg | ORAL_CAPSULE | Freq: Four times a day (QID) | ORAL | Status: DC | PRN
  Administered 2024-05-14: 25 mg via ORAL
  Filled 2024-05-14: qty 1

## 2024-05-14 MED ORDER — DIPHENHYDRAMINE HCL 50 MG/ML IJ SOLN: 12.5000 mg | INTRAMUSCULAR | Status: DC | PRN

## 2024-05-14 MED ORDER — FENTANYL CITRATE (PF) 100 MCG/2ML IJ SOLN
INTRAMUSCULAR | Status: AC
  Filled 2024-05-14: qty 2

## 2024-05-14 MED ORDER — LIDOCAINE HCL (PF) 1 % IJ SOLN
30.0000 mL | INTRAMUSCULAR | Status: AC | PRN
  Administered 2024-05-14: 30 mL via SUBCUTANEOUS
  Filled 2024-05-14: qty 30

## 2024-05-14 MED ORDER — ACETAMINOPHEN 325 MG PO TABS: 650.0000 mg | ORAL_TABLET | ORAL | Status: DC | PRN

## 2024-05-14 MED ORDER — CEFAZOLIN SODIUM-DEXTROSE 2-4 GM/100ML-% IV SOLN
2.0000 g | Freq: Once | INTRAVENOUS | Status: AC
  Administered 2024-05-14: 2 g via INTRAVENOUS
  Filled 2024-05-14: qty 100

## 2024-05-14 MED ORDER — OXYCODONE-ACETAMINOPHEN 5-325 MG PO TABS: 2.0000 | ORAL_TABLET | ORAL | Status: DC | PRN

## 2024-05-14 MED ORDER — SOD CITRATE-CITRIC ACID 500-334 MG/5ML PO SOLN: 30.0000 mL | ORAL | Status: DC | PRN

## 2024-05-14 MED ORDER — METHYLERGONOVINE MALEATE 0.2 MG/ML IJ SOLN
0.2000 mg | Freq: Once | INTRAMUSCULAR | Status: AC
  Administered 2024-05-14: 0.2 mg via INTRAMUSCULAR

## 2024-05-14 MED ORDER — ONDANSETRON HCL 4 MG/2ML IJ SOLN: 4.0000 mg | INTRAMUSCULAR | Status: DC | PRN

## 2024-05-14 MED ORDER — MISOPROSTOL 200 MCG PO TABS: 800.0000 ug | ORAL_TABLET | Freq: Once | ORAL | Status: AC

## 2024-05-14 MED ORDER — FLEET ENEMA RE ENEM: 1.0000 | ENEMA | RECTAL | Status: DC | PRN

## 2024-05-14 MED ORDER — COCONUT OIL OIL: 1.0000 | TOPICAL_OIL | Status: DC | PRN

## 2024-05-14 MED ORDER — METHYLERGONOVINE MALEATE 0.2 MG/ML IJ SOLN
INTRAMUSCULAR | Status: AC
  Filled 2024-05-14: qty 1

## 2024-05-14 MED ORDER — OXYTOCIN BOLUS FROM INFUSION
333.0000 mL | Freq: Once | INTRAVENOUS | Status: AC
  Administered 2024-05-14: 333 mL via INTRAVENOUS

## 2024-05-14 MED ORDER — TRANEXAMIC ACID-NACL 1000-0.7 MG/100ML-% IV SOLN
INTRAVENOUS | Status: AC
  Administered 2024-05-14: 1000 mg
  Filled 2024-05-14: qty 100

## 2024-05-14 MED ORDER — ZOLPIDEM TARTRATE 5 MG PO TABS: 5.0000 mg | ORAL_TABLET | Freq: Every evening | ORAL | Status: DC | PRN

## 2024-05-14 MED ORDER — SENNOSIDES-DOCUSATE SODIUM 8.6-50 MG PO TABS
2.0000 | ORAL_TABLET | Freq: Every day | ORAL | Status: DC
  Administered 2024-05-15: 2 via ORAL
  Filled 2024-05-14 (×2): qty 2

## 2024-05-14 MED ORDER — WITCH HAZEL-GLYCERIN EX PADS
1.0000 | MEDICATED_PAD | CUTANEOUS | Status: DC | PRN
  Administered 2024-05-15: 1 via TOPICAL

## 2024-05-14 MED ORDER — MISOPROSTOL 200 MCG PO TABS
ORAL_TABLET | ORAL | Status: AC
  Administered 2024-05-14: 800 ug via BUCCAL
  Filled 2024-05-14: qty 4

## 2024-05-14 MED ORDER — ONDANSETRON HCL 4 MG PO TABS: 4.0000 mg | ORAL_TABLET | ORAL | Status: DC | PRN

## 2024-05-14 MED ORDER — EPHEDRINE 5 MG/ML INJ: 10.0000 mg | INTRAVENOUS | Status: DC | PRN

## 2024-05-14 MED ORDER — LACTATED RINGERS IV SOLN: 500.0000 mL | Freq: Once | INTRAVENOUS | Status: DC

## 2024-05-14 MED ORDER — METHYLERGONOVINE MALEATE 0.2 MG/ML IJ SOLN: 0.2000 mg | INTRAMUSCULAR | Status: DC | PRN

## 2024-05-14 MED ORDER — OXYTOCIN-SODIUM CHLORIDE 30-0.9 UT/500ML-% IV SOLN
2.5000 [IU]/h | INTRAVENOUS | Status: DC
  Administered 2024-05-14: 2.5 [IU]/h via INTRAVENOUS
  Filled 2024-05-14 (×2): qty 500

## 2024-05-14 MED ORDER — PHENYLEPHRINE 80 MCG/ML (10ML) SYRINGE FOR IV PUSH (FOR BLOOD PRESSURE SUPPORT)
80.0000 ug | PREFILLED_SYRINGE | INTRAVENOUS | Status: DC | PRN
  Filled 2024-05-14: qty 10

## 2024-05-14 MED ORDER — SIMETHICONE 80 MG PO CHEW: 80.0000 mg | CHEWABLE_TABLET | ORAL | Status: DC | PRN

## 2024-05-14 MED ORDER — OXYCODONE-ACETAMINOPHEN 5-325 MG PO TABS
1.0000 | ORAL_TABLET | ORAL | Status: DC | PRN
  Administered 2024-05-14: 1 via ORAL
  Filled 2024-05-14: qty 1

## 2024-05-14 MED ORDER — PHENYLEPHRINE 80 MCG/ML (10ML) SYRINGE FOR IV PUSH (FOR BLOOD PRESSURE SUPPORT): 80.0000 ug | PREFILLED_SYRINGE | INTRAVENOUS | Status: DC | PRN

## 2024-05-14 MED ORDER — TETANUS-DIPHTH-ACELL PERTUSSIS 5-2.5-18.5 LF-MCG/0.5 IM SUSY: 0.5000 mL | PREFILLED_SYRINGE | Freq: Once | INTRAMUSCULAR | Status: DC

## 2024-05-14 MED ORDER — IBUPROFEN 600 MG PO TABS
600.0000 mg | ORAL_TABLET | Freq: Four times a day (QID) | ORAL | Status: DC
  Administered 2024-05-14 – 2024-05-16 (×9): 600 mg via ORAL
  Filled 2024-05-14 (×9): qty 1

## 2024-05-14 MED ORDER — LACTATED RINGERS IV SOLN: INTRAVENOUS | Status: DC

## 2024-05-14 NOTE — Progress Notes (Addendum)
 Called to confirm to dc suction and deflate Jada at tjis time. Dr Claire confirmed, suction turned off and removed 120 cc saline from Jada. Patient tolerated well.

## 2024-05-14 NOTE — Progress Notes (Signed)
 Jada removed, small rubra, patient used stedy to the restroom and was able to void.

## 2024-05-14 NOTE — Progress Notes (Signed)
 Post Partum Day 0 Subjective: Grady is doing well in the immediate postpartum period. Jada was removed by RN around 11:30am. Lochia appropriate since that time. Chiquetta has been able to ambulate and void. Tolerating PO. Minimal pain. Baby had to go to the NICU for respiratory support.  Objective:    05/14/2024    1:55 PM 05/14/2024   10:55 AM 05/14/2024    9:55 AM  Vitals with BMI  Systolic 113 127 865  Diastolic 84 86 79  Pulse 73 61 65     Physical Exam:  General: alert, cooperative, and no distress Lochia: appropriate Uterine Fundus: firm DVT Evaluation: No evidence of DVT seen on physical exam.  Recent Labs    05/14/24 0614  HGB 12.2  HCT 36.6    Assessment/Plan: Hilaria Titsworth is a 33 y.o. G5 now P2032 female PPD#0 s/p SVD at [redacted]w[redacted]d after presentng in labor.  -PPD0: Routine postpartum care. -PPH: Delivery and postpartum EBL 1203cc. Received TXA, pitocin , methergine , cytotec , and s/p uterine sweep and Jada (received 1 dose of Ancef ). Lochia now appropriate, patient asx of anemia. PPD1 CBC ordered. -APAS: Last took heparin at 10:30 last night. Given postpartum hemorrhage, plan SCDs today and 6w ppx Lovenox  to start tomorrow. -shoulder dystocia: delivery complicated by 45s SD. -history of molar pregnancy: Placenta sent to path -history of delayed postpartum hemorrhage: patient treated in MAU two weeks after prior delivery for heavy bleeding. Consider postpartum PO methergine  course. -h/o anemia: admit Hgb 12.2 -anxiety: on lexapro  10mg  daily -hypothyroidism: Synthroid  50mcg daily  Dispo: Continue care as above, anticipate discharge on PPD1-2.   LOS: 0 days   Rubie DELENA Husky, MD 05/14/2024, 2:10 PM

## 2024-05-14 NOTE — H&P (Signed)
 Kessie Croston is a 33 y.o. [phone] female at [redacted]w[redacted]d presenting for contractions that started around 4am. Denies LOF, VB, or decreased fetal movement.  Pregnancy otherwise complicated by: -APAS: on BID heparin 10,000 units BID. Last took at 10:30 last night. Plan 6w ppx Lovenox  to start when postpartum bleeding is scant. -history of molar pregnancy: G2: Molar pregnancy, GTD, Self resolved without surgery or chemo. Send placenta to path. -history of postpartum hemorrhage: EBLwith first delivery 1020cc. Received TXA, pitocin , methergine , and rectal cytotec .  -delayed postpartum hemorrhage: patient treated in MAU two weeks after prior delivery for heavy bleeding -polyhydramnios: AFI 26 on growth 7/17 -large for gestational age baby: 7/17 EFW 4140g/9lb2oz (>90%), AC 97.7%. -fetus with pelvic kidney -anemia -anxiety: on lexapro  10mg  daily -hypothyroidism: Synthroid  50mcg daily -marginal cord insertion  OB History     Gravida  5   Para  1   Term  1   Preterm      AB  3   Living  1      SAB  1   IAB  2   Ectopic      Multiple  0   Live Births  1        Obstetric Comments  Last pap: 10/26/16 Hx of abnormal pap: yes, in 2017, repeat normal History of STIs: denies Age at menarche: 57        Past Medical History:  Diagnosis Date   Anemia 03/26/2023   Anxiety    Depression    Grade I hemorrhoids 02/02/2023   History of molar pregnancy, antepartum 05/2019   Hypothyroidism    Missed ab    Multiple nevi 02/02/2023   Wears glasses    Past Surgical History:  Procedure Laterality Date   DILATION AND EVACUATION N/A 06/06/2019   Procedure: DILATATION AND EVACUATION;  Surgeon: Lilton Legions, DO;  Location: [facility];  Service: Gynecology;  Laterality: N/A;   HEMORRHOID SURGERY N/A 06/21/2023   Procedure: INTERNAL HEMORRHOIDECTOMY;  Surgeon: Sebastian Moles, MD;  Location:  SURGERY CENTER;  Service: General;  Laterality: N/A;   HYSTEROSCOPY  WITH D & C N/A 06/18/2020   Procedure: DILATATION AND EVACUATION;  Surgeon: Lilton Legions, DO;  Location: [facility];  Service: Gynecology;  Laterality: N/A;   KNEE SURGERY Right 2007   mass removed from under patella (per pt benign)   WISDOM TOOTH EXTRACTION  2011   Family History: family history includes Lupus in her maternal uncle; Uterine cancer in her paternal grandmother. Social History:  reports that she has never smoked. She has never been exposed to tobacco smoke. She has never used smokeless tobacco. She reports current alcohol use. She reports that she does not use drugs.     Maternal Diabetes: No Genetic Screening: Normal Maternal Ultrasounds/Referrals: Fetal Kidney Anomalies Fetal Ultrasounds or other Referrals:  Referred to Materal Fetal Medicine  Maternal Substance Abuse:  No Significant Maternal Medications:  Meds include: Other: heparin, synthroid , lexapro  Significant Maternal Lab Results:  Group B Strep negative Number of Prenatal Visits:greater than 3 verified prenatal visits Maternal Vaccinations:TDap   Review of Systems  All other systems reviewed and are negative.  Maternal Medical History:  Reason for admission: Contractions.   Contractions: Onset was 3-5 hours ago.   Frequency: regular.   Perceived severity is strong.   Fetal activity: Perceived fetal activity is normal.   Prenatal complications: Polyhydramnios.   No bleeding, cholelithiasis, HIV, PIH, infection, IUGR, nephrolithiasis, oligohydramnios, placental abnormality, pre-eclampsia, preterm labor, substance  abuse, thrombocytopenia or thrombophilia.   Prenatal Complications - Diabetes: none.   Dilation: 10 Effacement (%): 100 Station: 0, Plus 1 Exam by:: Dr. Claire Blood pressure 123/70, pulse 91, temperature 98.1 F (36.7 C), temperature source Oral, resp. rate 17, height 5' 4 (1.626 m), weight 86.6 kg, last menstrual period 08/21/2023, SpO2 100%, not currently  breastfeeding. Maternal Exam:  Uterine Assessment: Contraction strength is moderate.  Abdomen: Patient reports no abdominal tenderness. Estimated fetal weight is 9lb.   Fetal presentation: vertex Introitus: Normal vulva. Pelvis: adequate for delivery.   Cervix: Cervix evaluated by digital exam.     Fetal Exam Fetal Monitor Review: Mode: fetoscope.   Baseline rate: 125.  Variability: moderate (6-25 bpm).   Pattern: accelerations present and no decelerations.   Fetal State Assessment: Category I - tracings are normal.   Physical Exam Vitals reviewed.  Constitutional:      Appearance: Normal appearance.  HENT:     Head: Normocephalic.     Nose: Nose normal.  Eyes:     Extraocular Movements: Extraocular movements intact.  Cardiovascular:     Comments: Well perfused Pulmonary:     Effort: Pulmonary effort is normal.  Abdominal:     Comments: Non-tender  Genitourinary:    General: Normal vulva.  Musculoskeletal:        General: Normal range of motion.     Cervical back: Normal range of motion.     Comments: Bruising from heparin injections  Skin:    General: Skin is warm and dry.  Neurological:     General: No focal deficit present.     Mental Status: She is oriented to person, place, and time.  Psychiatric:        Mood and Affect: Mood normal.        Behavior: Behavior normal.        Thought Content: Thought content normal.        Judgment: Judgment normal.     Prenatal labs: ABO, Rh: --/--/A POS (07/20 9385) Antibody: NEG (07/20 9385) Rubella:  immune RPR:   NR HBsAg:   NR HIV:   NR GBS:   neg  Assessment/Plan: Adelfa Lozito is a 33 y.o. G32P1031 female at [redacted]w[redacted]d in labor.  -Labor: presented at 7cm, quickly progressed to 8cm, incidental AROM with meconium during exam. Patient unable to get epidural due to heparin. Patient managing well with nitrous oxide. Expectant management. -FWB: Category 1  -APAS: on BID heparin 10,000 units BID. Last took at 10:30  last night. Plan 6w ppx Lovenox  to start when postpartum bleeding is scant. -history of molar pregnancy: G2: Molar pregnancy, GTD, Self resolved without surgery or chemo. Send placenta to path. -history of postpartum hemorrhage: EBLwith first delivery 1020cc. Received TXA, pitocin , methergine , and rectal cytotec .  -delayed postpartum hemorrhage: patient treated in MAU two weeks after prior delivery for heavy bleeding. Plan postpartum PO methergine  course. -polyhydramnios: AFI 26 on growth 7/17 -large for gestational age baby: 7/17 EFW 4140g/9lb2oz (>90%), AC 97.7%. -fetus with pelvic kidney -h/o anemia: admit Hgb 12.2 -anxiety: on lexapro  10mg  daily -hypothyroidism: Synthroid  50mcg daily -marginal cord insertion   Dispo: Anticipate vaginal delivery of baby girl [place]!  Aubert Choyce A Traveion Ruddock 05/14/2024, 8:37 AM

## 2024-05-15 LAB — CBC
HCT: 35 % — ABNORMAL LOW (ref 36.0–46.0)
Hemoglobin: 11.5 g/dL — ABNORMAL LOW (ref 12.0–15.0)
MCH: 28.2 pg (ref 26.0–34.0)
MCHC: 32.9 g/dL (ref 30.0–36.0)
MCV: 85.8 fL (ref 80.0–100.0)
Platelets: 160 K/uL (ref 150–400)
RBC: 4.08 MIL/uL (ref 3.87–5.11)
RDW: 17.1 % — ABNORMAL HIGH (ref 11.5–15.5)
WBC: 11.2 K/uL — ABNORMAL HIGH (ref 4.0–10.5)
nRBC: 0 % (ref 0.0–0.2)

## 2024-05-15 MED ORDER — ENOXAPARIN SODIUM 60 MG/0.6ML IJ SOSY: 0.5000 mg/kg | PREFILLED_SYRINGE | INTRAMUSCULAR | Status: DC

## 2024-05-15 MED ORDER — LEVOTHYROXINE SODIUM 50 MCG PO TABS
25.0000 ug | ORAL_TABLET | Freq: Every day | ORAL | Status: DC
  Administered 2024-05-16: 25 ug via ORAL
  Filled 2024-05-15: qty 1

## 2024-05-15 MED ORDER — METHYLERGONOVINE MALEATE 0.2 MG PO TABS
0.2000 mg | ORAL_TABLET | Freq: Three times a day (TID) | ORAL | Status: DC
  Administered 2024-05-15 – 2024-05-16 (×3): 0.2 mg via ORAL
  Filled 2024-05-15 (×3): qty 1

## 2024-05-15 MED ORDER — ENOXAPARIN SODIUM 40 MG/0.4ML IJ SOSY
40.0000 mg | PREFILLED_SYRINGE | INTRAMUSCULAR | Status: DC
  Administered 2024-05-15: 40 mg via SUBCUTANEOUS
  Filled 2024-05-15: qty 0.4

## 2024-05-15 NOTE — Lactation Note (Signed)
 This note was copied from a baby's chart. Lactation Consultation Note  Patient Name: Betty Chung Date: 05/15/2024 Age:33 hours  Reason for consult: Early term 37-38.6wks;NICU baby  P2, [redacted]w[redacted]d, NICU baby  Baby was transferred from the [facility] to the NICU on 05/14/2024. Mother desired to only formula feed her baby while baby was in [place] care. Since baby has been transferred, mother requested to start pumping this morning. Her nurse set up and instructed mother  regarding use of the DEBP. Mother is currently visiting her baby in NICU.   Mother has not been seen by Lactation since mother has decided to pump. Will attempt to see mother today.      Consult Status Consult Status: NICU follow-up Date: 05/15/24 Follow-up type: In-patient    Joshua Line M 05/15/2024, 11:28 AM

## 2024-05-15 NOTE — Progress Notes (Addendum)
 Post Partum Day 1 Subjective: Scant bleeding. Pain controlled. Recently came back from visiting baby in NICU. Ambulating and tolerating PO. No dizziness with ambulation  Objective: Blood pressure 109/68, pulse 68, temperature 97.7 F (36.5 C), temperature source Oral, resp. rate 16, height 5' 4 (1.626 m), weight 86.6 kg, last menstrual period 08/21/2023, SpO2 98%, unknown if currently breastfeeding.  Physical Exam:  General: alert, cooperative, and no distress Lochia: appropriate Uterine Fundus: firm DVT Evaluation: No cords or calf tenderness. No significant calf/ankle edema.  Recent Labs    05/14/24 0614 05/15/24 0742  HGB 12.2 11.5*  HCT 36.6 35.0*    Assessment/Plan: 33 yo G5 now P2032 PPD# 1 s/p NSVD - PP: Continue routine care - Postpartum hemorrhage: s/p Jada. Bleeding scant. 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 Will do course of PO methergine . Hb 12-->11.5, suspect lower (in the range of 9-10) given EBL however no additional labs ordered given asymptomatic and not likely to change management significantly - APLS: Restart lovenox   - Hypothyroidism: pre-pregnancy dose synthroid  resumed - Anxiety: continue lexapro  - Dispo: anticipate DC home tomorrow   LOS: 1 day   Larraine DELENA Sharps, DO 05/15/2024, 1:08 PM

## 2024-05-15 NOTE — Progress Notes (Signed)

## 2024-05-16 ENCOUNTER — Inpatient Hospital Stay (HOSPITAL_COMMUNITY)

## 2024-05-16 MED ORDER — METHYLERGONOVINE MALEATE 0.2 MG PO TABS: 0.2000 mg | ORAL_TABLET | Freq: Three times a day (TID) | ORAL | 0 refills | Status: DC

## 2024-05-16 MED ORDER — IBUPROFEN 600 MG PO TABS: 600.0000 mg | ORAL_TABLET | Freq: Four times a day (QID) | ORAL | 0 refills | Status: DC

## 2024-05-16 MED ORDER — LEVOTHYROXINE SODIUM 25 MCG PO TABS: 25.0000 ug | ORAL_TABLET | Freq: Every day | ORAL | 1 refills | Status: AC

## 2024-05-16 NOTE — Plan of Care (Signed)
 Problem: Education: Goal: Knowledge of General Education information will improve Description: Including pain rating scale, medication(s)/side effects and non-pharmacologic comfort measures 05/16/2024 1307 by Madison Rosina LABOR, LPN Outcome: Adequate for Discharge 05/16/2024 0727 by Madison Rosina LABOR, LPN Outcome: Progressing   Problem: Health Behavior/Discharge Planning: Goal: Ability to manage health-related needs will improve 05/16/2024 1307 by Madison Rosina LABOR, LPN Outcome: Adequate for Discharge 05/16/2024 0727 by Madison Rosina LABOR, LPN Outcome: Progressing   Problem: Clinical Measurements: Goal: Ability to maintain clinical measurements within normal limits will improve 05/16/2024 1307 by Madison Rosina LABOR, LPN Outcome: Adequate for Discharge 05/16/2024 0727 by Madison Rosina LABOR, LPN Outcome: Progressing Goal: Will remain free from infection 05/16/2024 1307 by Madison Rosina LABOR, LPN Outcome: Adequate for Discharge 05/16/2024 0727 by Madison Rosina LABOR, LPN Outcome: Progressing Goal: Diagnostic test results will improve 05/16/2024 1307 by Madison Rosina LABOR, LPN Outcome: Adequate for Discharge 05/16/2024 0727 by Madison Rosina LABOR, LPN Outcome: Progressing Goal: Respiratory complications will improve 05/16/2024 1307 by Madison Rosina LABOR, LPN Outcome: Adequate for Discharge 05/16/2024 0727 by Madison Rosina LABOR, LPN Outcome: Progressing Goal: Cardiovascular complication will be avoided 05/16/2024 1307 by Madison Rosina LABOR, LPN Outcome: Adequate for Discharge 05/16/2024 0727 by Madison Rosina LABOR, LPN Outcome: Progressing   Problem: Coping: Goal: Level of anxiety will decrease 05/16/2024 1307 by Madison Rosina LABOR, LPN Outcome: Adequate for Discharge 05/16/2024 0727 by Madison Rosina LABOR, LPN Outcome: Progressing   Problem: Elimination: Goal: Will not experience complications related to bowel motility 05/16/2024 1307 by Madison Rosina LABOR, LPN Outcome: Adequate for Discharge 05/16/2024 0727 by Madison Rosina LABOR, LPN Outcome:  Progressing   Problem: Pain Managment: Goal: General experience of comfort will improve and/or be controlled 05/16/2024 1307 by Madison Rosina LABOR, LPN Outcome: Adequate for Discharge 05/16/2024 0727 by Madison Rosina LABOR, LPN Outcome: Progressing   Problem: Safety: Goal: Ability to remain free from injury will improve 05/16/2024 1307 by Madison Rosina LABOR, LPN Outcome: Adequate for Discharge 05/16/2024 0727 by Madison Rosina LABOR, LPN Outcome: Progressing   Problem: Skin Integrity: Goal: Risk for impaired skin integrity will decrease 05/16/2024 1307 by Madison Rosina LABOR, LPN Outcome: Adequate for Discharge 05/16/2024 0727 by Madison Rosina LABOR, LPN Outcome: Progressing   Problem: Education: Goal: Knowledge of Childbirth will improve 05/16/2024 1307 by Madison Rosina LABOR, LPN Outcome: Adequate for Discharge 05/16/2024 0727 by Madison Rosina LABOR, LPN Outcome: Progressing Goal: Ability to make informed decisions regarding treatment and plan of care will improve 05/16/2024 1307 by Madison Rosina LABOR, LPN Outcome: Adequate for Discharge 05/16/2024 9272 by Madison Rosina LABOR, LPN Outcome: Progressing Goal: Ability to state and carry out methods to decrease the pain will improve 05/16/2024 1307 by Madison Rosina LABOR, LPN Outcome: Adequate for Discharge 05/16/2024 0727 by Madison Rosina LABOR, LPN Outcome: Progressing Goal: Individualized Educational Video(s) 05/16/2024 1307 by Madison Rosina LABOR, LPN Outcome: Adequate for Discharge 05/16/2024 0727 by Madison Rosina LABOR, LPN Outcome: Progressing   Problem: Coping: Goal: Ability to verbalize concerns and feelings about labor and delivery will improve 05/16/2024 1307 by Madison Rosina LABOR, LPN Outcome: Adequate for Discharge 05/16/2024 0727 by Madison Rosina LABOR, LPN Outcome: Progressing   Problem: Life Cycle: Goal: Ability to make normal progression through stages of labor will improve 05/16/2024 1307 by Madison Rosina LABOR, LPN Outcome: Adequate for Discharge 05/16/2024 0727 by Madison Rosina LABOR,  LPN Outcome: Progressing Goal: Ability to effectively push during vaginal delivery will improve 05/16/2024 1307 by Madison Rosina LABOR, LPN Outcome: Adequate for Discharge 05/16/2024 0727 by  Madison Knee A, LPN Outcome: Progressing   Problem: Role Relationship: Goal: Will demonstrate positive interactions with the child 05/16/2024 1307 by Madison Knee LABOR, LPN Outcome: Adequate for Discharge 05/16/2024 9272 by Madison Knee LABOR, LPN Outcome: Progressing   Problem: Safety: Goal: Risk of complications during labor and delivery will decrease 05/16/2024 1307 by Madison Knee LABOR, LPN Outcome: Adequate for Discharge 05/16/2024 0727 by Madison Knee LABOR, LPN Outcome: Progressing   Problem: Pain Management: Goal: Relief or control of pain from uterine contractions will improve 05/16/2024 1307 by Madison Knee LABOR, LPN Outcome: Adequate for Discharge 05/16/2024 0727 by Madison Knee LABOR, LPN Outcome: Progressing   Problem: Education: Goal: Knowledge of condition will improve 05/16/2024 1307 by Madison Knee LABOR, LPN Outcome: Adequate for Discharge 05/16/2024 0727 by Madison Knee LABOR, LPN Outcome: Progressing Goal: Individualized Educational Video(s) 05/16/2024 1307 by Madison Knee LABOR, LPN Outcome: Adequate for Discharge 05/16/2024 0727 by Madison Knee LABOR, LPN Outcome: Progressing Goal: Individualized Newborn Educational Video(s) 05/16/2024 1307 by Madison Knee LABOR, LPN Outcome: Adequate for Discharge 05/16/2024 0727 by Madison Knee LABOR, LPN Outcome: Progressing   Problem: Activity: Goal: Will verbalize the importance of balancing activity with adequate rest periods 05/16/2024 1307 by Madison Knee LABOR, LPN Outcome: Adequate for Discharge 05/16/2024 9272 by Madison Knee LABOR, LPN Outcome: Progressing Goal: Ability to tolerate increased activity will improve 05/16/2024 1307 by Madison Knee LABOR, LPN Outcome: Adequate for Discharge 05/16/2024 0727 by Madison Knee LABOR, LPN Outcome: Progressing   Problem: Coping: Goal: Ability to  identify and utilize available resources and services will improve 05/16/2024 1307 by Madison Knee LABOR, LPN Outcome: Adequate for Discharge 05/16/2024 0727 by Madison Knee LABOR, LPN Outcome: Progressing   Problem: Life Cycle: Goal: Chance of risk for complications during the postpartum period will decrease 05/16/2024 1307 by Madison Knee LABOR, LPN Outcome: Adequate for Discharge 05/16/2024 0727 by Madison Knee LABOR, LPN Outcome: Progressing   Problem: Role Relationship: Goal: Ability to demonstrate positive interaction with newborn will improve 05/16/2024 1307 by Madison Knee LABOR, LPN Outcome: Adequate for Discharge 05/16/2024 0727 by Madison Knee LABOR, LPN Outcome: Progressing   Problem: Skin Integrity: Goal: Demonstration of wound healing without infection will improve 05/16/2024 1307 by Madison Knee LABOR, LPN Outcome: Adequate for Discharge 05/16/2024 0727 by Madison Knee LABOR, LPN Outcome: Progressing

## 2024-05-16 NOTE — Progress Notes (Signed)
 Patient is doing well.  She is ambulating, voiding, tolerating PO.  Pain control is good.  Lochia is appropriate  Vitals:   05/15/24 0850 05/15/24 1355 05/15/24 2202 05/16/24 0614  BP: 109/68 110/76 117/76 127/82  Pulse: 68 73 60 (!) 52  Resp: 16  17 18   Temp: 97.7 F (36.5 C) 97.8 F (36.6 C) 98.2 F (36.8 C) 98.6 F (37 C)  TempSrc: Oral Oral Oral Oral  SpO2: 98% 100% 99%   Weight:      Height:        NAD Fundus firm Ext: no edema  Lab Results  Component Value Date   WBC 11.2 (H) 05/15/2024   HGB 11.5 (L) 05/15/2024   HCT 35.0 (L) 05/15/2024   MCV 85.8 05/15/2024   PLT 160 05/15/2024    --/--/A POS (07/20 9385)  A/P 32 y.o. [id number] PPD#2 s/p SVD at 38 weeks, c/b PPH. Routine postpartum care.   Postpartum hemorrhage: s/p Jada. Bleeding scant. 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 Will do course of PO methergine  x 3 days. Hb 12-->11.5, suspect lower (in the range of 9-10) given EBL however no additional labs ordered given asymptomatic and not likely to change management significantly APLS: Restart lovenox   Hypothyroidism: pre-pregnancy dose synthroid  resumed Anxiety: continue lexapro  Meeting all goals, d/c to home  [facility] A GEFFEL GRETTA

## 2024-05-16 NOTE — Plan of Care (Signed)
  Problem: Education: Goal: Knowledge of General Education information will improve Description: Including pain rating scale, medication(s)/side effects and non-pharmacologic comfort measures Outcome: Progressing   Problem: Health Behavior/Discharge Planning: Goal: Ability to manage health-related needs will improve Outcome: Progressing   Problem: Clinical Measurements: Goal: Ability to maintain clinical measurements within normal limits will improve Outcome: Progressing Goal: Will remain free from infection Outcome: Progressing Goal: Diagnostic test results will improve Outcome: Progressing Goal: Respiratory complications will improve Outcome: Progressing Goal: Cardiovascular complication will be avoided Outcome: Progressing   Problem: Coping: Goal: Level of anxiety will decrease Outcome: Progressing   Problem: Elimination: Goal: Will not experience complications related to bowel motility Outcome: Progressing   Problem: Pain Managment: Goal: General experience of comfort will improve and/or be controlled Outcome: Progressing   Problem: Safety: Goal: Ability to remain free from injury will improve Outcome: Progressing   Problem: Skin Integrity: Goal: Risk for impaired skin integrity will decrease Outcome: Progressing   Problem: Education: Goal: Knowledge of Childbirth will improve Outcome: Progressing Goal: Ability to make informed decisions regarding treatment and plan of care will improve Outcome: Progressing Goal: Ability to state and carry out methods to decrease the pain will improve Outcome: Progressing Goal: Individualized Educational Video(s) Outcome: Progressing   Problem: Coping: Goal: Ability to verbalize concerns and feelings about labor and delivery will improve Outcome: Progressing   Problem: Life Cycle: Goal: Ability to make normal progression through stages of labor will improve Outcome: Progressing Goal: Ability to effectively push during  vaginal delivery will improve Outcome: Progressing   Problem: Role Relationship: Goal: Will demonstrate positive interactions with the child Outcome: Progressing   Problem: Safety: Goal: Risk of complications during labor and delivery will decrease Outcome: Progressing   Problem: Pain Management: Goal: Relief or control of pain from uterine contractions will improve Outcome: Progressing   Problem: Education: Goal: Knowledge of condition will improve Outcome: Progressing Goal: Individualized Educational Video(s) Outcome: Progressing Goal: Individualized Newborn Educational Video(s) Outcome: Progressing   Problem: Activity: Goal: Will verbalize the importance of balancing activity with adequate rest periods Outcome: Progressing Goal: Ability to tolerate increased activity will improve Outcome: Progressing   Problem: Coping: Goal: Ability to identify and utilize available resources and services will improve Outcome: Progressing   Problem: Life Cycle: Goal: Chance of risk for complications during the postpartum period will decrease Outcome: Progressing   Problem: Role Relationship: Goal: Ability to demonstrate positive interaction with newborn will improve Outcome: Progressing   Problem: Skin Integrity: Goal: Demonstration of wound healing without infection will improve Outcome: Progressing

## 2024-05-16 NOTE — Discharge Summary (Signed)
 Postpartum Discharge Summary  Date of Service updated 05/16/24     Patient Name: Betty Chung DOB: [birth year] MRN: [id number]  Date of admission: 05/14/2024 Delivery date:05/14/2024 Delivering provider: CLAIRE RAMAN A Date of discharge: 05/16/2024  Admitting diagnosis: Normal labor and delivery [O80] Intrauterine pregnancy: [redacted]w[redacted]d     Secondary diagnosis:  Principal Problem:   Normal labor and delivery  Additional problems: Antiphospholipid antibody syndrome    Discharge diagnosis: Term Pregnancy Delivered and PPH                                              Post partum procedures:n/a Augmentation: AROM Complications: Hemorrhage>1063mL  Hospital course: Onset of Labor With Vaginal Delivery      33 y.o. yo [id number] at [redacted]w[redacted]d was admitted in Active Labor on 05/14/2024. Labor course was complicated by rapid labor / delivery as well as shoulder dystocia.  Unable to receive epidural due to recent heparin administration. PPH--received TXA, pitocin , methergine , cytotec , Jada.  After Jada, bleeding controlled and deflated / removed per protocol.  Total EBL  1200 mL Membrane Rupture Time/Date: 6:40 AM,05/14/2024  Delivery Method:Vaginal, Spontaneous Operative Delivery:N/A Episiotomy: None Lacerations:  2nd degree Patient had a postpartum course complicated by  n/a.  She is ambulating, tolerating a regular diet, passing flatus, and urinating well. Plan for 6 weeks lovenox  postpartum (has prescription at home, rx not needed).  Methergine  series x 3 days given h/o delayed PPH.  Patient is discharged home in stable condition on 05/16/24.  Newborn Data: Birth date:05/14/2024 Birth time:7:27 AM Gender:Female Living status:Living Apgars:6 ,9  Weight:4070 g  Magnesium Sulfate received: No BMZ received: No Rhophylac:N/A MMR:N/A T-DaP:Given prenatally Flu: No RSV Vaccine received: No Transfusion:No Immunizations administered: Immunization History  Administered Date(s) Administered    Influenza Inj Mdck Quad Pf 08/25/2022   Tdap 08/11/2022, 02/24/2024    Physical exam  Vitals:   05/15/24 0850 05/15/24 1355 05/15/24 2202 05/16/24 0614  BP: 109/68 110/76 117/76 127/82  Pulse: 68 73 60 (!) 52  Resp: 16  17 18   Temp: 97.7 F (36.5 C) 97.8 F (36.6 C) 98.2 F (36.8 C) 98.6 F (37 C)  TempSrc: Oral Oral Oral Oral  SpO2: 98% 100% 99%   Weight:      Height:       General: alert, cooperative, and no distress Lochia: appropriate Uterine Fundus: firm Incision: N/A DVT Evaluation: No evidence of DVT seen on physical exam. Labs: Lab Results  Component Value Date   WBC 11.2 (H) 05/15/2024   HGB 11.5 (L) 05/15/2024   HCT 35.0 (L) 05/15/2024   MCV 85.8 05/15/2024   PLT 160 05/15/2024      Latest Ref Rng & Units 07/29/2023    8:38 AM  CMP  Glucose 70 - 99 mg/dL 96   BUN 6 - 20 mg/dL 13   Creatinine 9.42 - 1.00 mg/dL 9.22   Sodium 865 - 855 mmol/L 140   Potassium 3.5 - 5.2 mmol/L 4.0   Chloride 96 - 106 mmol/L 101   CO2 20 - 29 mmol/L 24   Calcium 8.7 - 10.2 mg/dL 9.5   Total Protein 6.0 - 8.5 g/dL 7.1   Total Bilirubin 0.0 - 1.2 mg/dL 0.4   Alkaline Phos 44 - 121 IU/L 85   AST 0 - 40 IU/L 14   ALT 0 - 32 IU/L 14  Edinburgh Score:    05/14/2024   10:05 PM  Edinburgh Postnatal Depression Scale Screening Tool  I have been able to laugh and see the funny side of things. 0  I have looked forward with enjoyment to things. 0  I have blamed myself unnecessarily when things went wrong. 0  I have been anxious or worried for no good reason. 0  I have felt scared or panicky for no good reason. 0  Things have been getting on top of me. 0  I have been so unhappy that I have had difficulty sleeping. 0  I have felt sad or miserable. 0  I have been so unhappy that I have been crying. 0  The thought of harming myself has occurred to me. 0  Edinburgh Postnatal Depression Scale Total 0      After visit meds:  Allergies as of 05/16/2024   No Known Allergies       Medication List     STOP taking these medications    amoxicillin -clavulanate 875-125 MG tablet Commonly known as: AUGMENTIN    ondansetron  4 MG disintegrating tablet Commonly known as: ZOFRAN -ODT       TAKE these medications    albuterol  108 (90 Base) MCG/ACT inhaler Commonly known as: VENTOLIN  HFA Inhale 1-2 puffs into the lungs every 6 (six) hours as needed for wheezing or shortness of breath.   enoxaparin  40 MG/0.4ML injection Commonly known as: LOVENOX  Inject 40 mg into the skin daily.   escitalopram  10 MG tablet Commonly known as: LEXAPRO  Take 1 tablet (10 mg total) by mouth daily.   ibuprofen  600 MG tablet Commonly known as: ADVIL  Take 1 tablet (600 mg total) by mouth every 6 (six) hours.   levothyroxine  25 MCG tablet Commonly known as: SYNTHROID  Take 1 tablet (25 mcg total) by mouth daily.   methylergonovine 0.2 MG tablet Commonly known as: METHERGINE  Take 1 tablet (0.2 mg total) by mouth 3 (three) times daily.         Discharge home in stable condition Infant Feeding: Breast Infant Disposition:NICU Discharge instruction: per After Visit Summary and Postpartum booklet. Activity: Advance as tolerated. Pelvic rest for 6 weeks.  Diet: routine diet Anticipated Birth Control: Unsure Postpartum Appointment:4 weeks Additional Postpartum F/U: n/a Future Appointments: Future Appointments  Date Time Provider Department Center  07/27/2024  3:30 PM Chandra Toribio POUR, MD [facility] None   Follow up Visit:  Follow-up Information     Claire Rubie LABOR, MD Follow up in 4 week(s).   Specialty: Obstetrics and Gynecology Contact information: [address] [place] [address] [phone]                     05/16/2024 [facility] VELNA GASKINS, MD

## 2024-05-16 NOTE — Lactation Note (Signed)
 This note was copied from a baby's chart.  NICU Lactation Consultation Note  Patient Name: Betty Chung Unijb'd Date: 05/16/2024 Age:33 years  Reason for consult: Initial assessment; NICU baby; 1st time breastfeeding; Early term 37-38.6wks; Maternal endocrine disorder; Exclusive pumping and bottle feeding Type of Endocrine Disorder?: Thyroid  (hypothyroid) Shoulder dystocia, fractured clavicle and PPH of >1000 ml SUBJECTIVE  LC in to visit with P2 Mom of baby Betty Chung born vaginally and came to NICU at 4 hrs of life for respiratory support and hypoglycemia.    Mom had started out saying formula only due to difficult time with first baby (4 months old) and readmission for delayed PPH.    Mom states she wants to try to bring in her milk supply and pump and bottle feed. Baby is ad lib bottle feeding and doing very well.  LC fitted Mom with pumping band and 18 mm flanges.  Assisted with Mom's 3rd pumping.  Talked about the importance of consistent frequent pumping to help establish milk supply.  Mom doesn't have a pump for home, referred to rental in gift shop.    Engorgement prevention and treatment reviewed.  Plan recommended- 1- STS with baby 2- massage breasts and hand express (demonstrated this with Mom) 3- Pump both breasts on initiation setting every 2-3 hrs or when baby is feeding.  LC set up a washing and a drying bin for pump parts and reviewed importance of disassembling pump parts for washing and for drying.  OBJECTIVE Infant data: Mother's Current Feeding Choice: Breast Milk and Formula  O2 Device: Room Air O2 Flow Rate (L/min): 1 L/min FiO2 (%): 21 %  Infant feeding assessment IDFTS - Readiness: 1 IDFTS - Quality: 2   Maternal data: [id number] Vaginal, Spontaneous Has patient been taught Hand Expression?: Yes Hand Expression Comments: glistening of colostrum noted Significant Breast History:: ++ breast changes Current breast feeding challenges::  infant admitted to NICU for respiratory support Previous breastfeeding challenges?: Low milk supply Does the patient have breastfeeding experience prior to this delivery?: Yes How long did the patient breastfeed?: brief as baby lost weight, Mom readmitted to delayed PPH, stopped pumping Pumping frequency: Mom has pumped twice, encouraged to pump with every feeding, or 8+ times per 24 hrs Pumped volume: 0 mL Flange Size: 18 Hands-free pumping top sizes: Large [place]) Risk factor for low/delayed milk supply:: delayed onset of pumping  WIC Program: No WIC Referral Sent?: No Pump: Refer for rental, Advised to call insurance company  ASSESSMENT Infant:  Feeding Status: Ad lib Feeding method: Bottle Nipple Type: Nfant Slow Flow (purple)  Maternal: Milk volume: Low  INTERVENTIONS/PLAN Interventions: Interventions: Breast feeding basics reviewed; Skin to skin; Breast massage; Hand express; Education; DEBP; LC Services brochure Discharge Education: Engorgement and breast care; Outpatient recommendation Tools: Pump; Flanges; Hands-free pumping top Pump Education: Setup, frequency, and cleaning; Milk Storage  Plan: Consult Status: NICU follow-up NICU Follow-up type: Verify onset of copious milk; Verify absence of engorgement; Verify DEBP issuance   Claudene Aleck BRAVO 05/16/2024, 10:00 AM

## 2024-05-17 LAB — SURGICAL PATHOLOGY

## 2024-05-31 ENCOUNTER — Telehealth (HOSPITAL_COMMUNITY): Payer: Self-pay | Admitting: *Deleted

## 2024-05-31 NOTE — Telephone Encounter (Signed)
 05/31/2024  Name: Betty Chung MRN: [id number] DOB: 11-20-1990  Reason for Call:  Transition of Care Hospital Discharge Call  Contact Status: Patient Contact Status: Complete  Language assistant needed: Interpreter Mode: Interpreter Not Needed        Follow-Up Questions: Do You Have Any Concerns About Your Health As You Heal From Delivery?: No Do You Have Any Concerns About Your Infants Health?:  (infant readmitted to Peds on 8/5 for fever)  Edinburgh Postnatal Depression Scale:  In the Past 7 Days:    PHQ2-9 Depression Scale:     Discharge Follow-up: Edinburgh score requires follow up?:  (deferred screening today, baby is on Peds for fever, she endorses she is coping well and she feels good about the care and communication on the Peds unit)  Post-discharge interventions: NA  Mliss Sieve, RN 05/31/2024 16:14

## 2024-07-05 ENCOUNTER — Ambulatory Visit

## 2024-07-05 VITALS — BP 104/71 | HR 85 | Wt 171.0 lb

## 2024-07-05 DIAGNOSIS — K644 Residual hemorrhoidal skin tags: Secondary | ICD-10-CM | POA: Diagnosis not present

## 2024-07-05 DIAGNOSIS — L301 Dyshidrosis [pompholyx]: Secondary | ICD-10-CM | POA: Diagnosis not present

## 2024-07-05 DIAGNOSIS — M25551 Pain in right hip: Secondary | ICD-10-CM | POA: Diagnosis not present

## 2024-07-05 MED ORDER — CELECOXIB 200 MG PO CAPS: 200.0000 mg | ORAL_CAPSULE | Freq: Two times a day (BID) | ORAL | 1 refills | Status: AC

## 2024-07-05 MED ORDER — TRIAMCINOLONE ACETONIDE 0.1 % EX OINT: 1.0000 | TOPICAL_OINTMENT | Freq: Two times a day (BID) | CUTANEOUS | 0 refills | Status: AC

## 2024-07-05 MED ORDER — HYDROCORTISONE (PERIANAL) 2.5 % EX CREA: 1.0000 | TOPICAL_CREAM | Freq: Two times a day (BID) | CUTANEOUS | 0 refills | Status: DC

## 2024-07-05 MED ORDER — CYCLOBENZAPRINE HCL 10 MG PO TABS: 10.0000 mg | ORAL_TABLET | Freq: Every day | ORAL | 0 refills | Status: AC

## 2024-07-05 NOTE — Assessment & Plan Note (Signed)
 External hemorrhoids present, no internal hemorrhoids. Previous internal hemorrhoids surgically removed. - Prescribe hydrocortisone  2.5% cream, apply twice daily for 10 days. - Continue using Preparation H with prescription cream. - Consider alternative treatments or referral to GI if no improvement.

## 2024-07-05 NOTE — Progress Notes (Signed)
 Acute Office Visit  Subjective:     Patient ID: Betty Chung, female    DOB: [birth year], 33 y.o.   MRN: [id number]  Chief Complaint  Patient presents with   Acute Visit    Patient in room #4 and alone. Pt states she has Itchy bumps on right hand that started about several few ago and hemorrhoids.    HPI  History of Present Illness   Betty Chung is a 33 year old female who presents with a rash on her hands and external hemorrhoids.  Pruritic hand rash and xerosis - Pruritic rash on hands present for seven weeks, beginning after the birth of her second child - Rash is persistent and spreading, initially on palms and now involving fingers - Associated with extreme dryness of the hands - Previous similar episodes resolved spontaneously, but this episode is persistent - No topical creams used for this condition in the past 15 years  External hemorrhoids - External hemorrhoids present, previously treated with over-the-counter Preparation H, which she continues to use - History of internal hemorrhoids, previously surgically removed - No current symptoms of internal hemorrhoids  Right hip pain - Right hip pain present since childbirth, consistent and increasingly bothersome - Pain is exacerbated by lying on the right side and sometimes by walking - Unable to sleep on right side due to pain - Not currently using ibuprofen  or Advil  600 mg, though previously used  Postpartum status - Seven weeks postpartum following the birth of her second child - Not currently breastfeeding - Taking thyroid  medication and Lexapro  daily      ROS Per HPI     Objective:    BP 104/71 (BP Location: Left Arm, Patient Position: Sitting, Cuff Size: Normal)   Pulse 85   Wt 171 lb (77.6 kg)   LMP 08/21/2023   SpO2 97%   Breastfeeding No   BMI 29.35 kg/m      No results found for any visits on 07/05/24.      Assessment & Plan:   Right hip pain Assessment & Plan: Right hip  pain likely due to inflammation or muscle strain. Bursitis unlikely given location over hip flexor, but cannot be excluded today. - Prescribe Celebrex  200 mg, take twice daily as needed with food. - Consider x-ray if pain persists after 4-6 weeks. - Provide home exercises and stretches PDF - Offer Flexeril  for nighttime use, start with 5 mg, increase to 10 mg if needed.   External hemorrhoids Assessment & Plan: External hemorrhoids present, no internal hemorrhoids. Previous internal hemorrhoids surgically removed. - Prescribe hydrocortisone  2.5% cream, apply twice daily for 10 days. - Continue using Preparation H with prescription cream. - Consider alternative treatments or referral to GI if no improvement.   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 - Prescribe triamcinolone  0.1% cream, apply twice daily for 10 days. - Consider stronger cream or dermatology referral if no improvement.   Other orders -     Hydrocortisone  (Perianal); Place 1 Application rectally 2 (two) times daily.  Dispense: 30 g; Refill: 0 -     Triamcinolone  Acetonide; Apply 1 Application topically 2 (two) times daily.  Dispense: 30 g; Refill: 0 -     Celecoxib ; Take 1 capsule (200 mg total) by mouth 2 (two) times daily.  Dispense: 30 capsule; Refill: 1 -     Cyclobenzaprine  HCl; Take 1 tablet (10 mg total) by mouth at bedtime.  Dispense: 30 tablet; Refill: 0       Return if symptoms worsen or fail to improve.  Saddie JULIANNA Sacks, PA-C

## 2024-07-05 NOTE — Patient Instructions (Signed)
 VISIT SUMMARY: During today's visit, we discussed your ongoing issues with a rash on your hands, external hemorrhoids, and right hip pain. We also reviewed your postpartum status and current medications.  YOUR PLAN: -RIGHT HIP PAIN: Your right hip pain is likely due to inflammation or muscle strain. We have prescribed Celebrex  to be taken twice daily with food as needed. If the pain persists after 4-6 weeks, we may consider an x-ray. Additionally, we have provided you with home exercises and stretches, and you can use a muscle relaxer at night, starting with 5 mg and increasing to 10 mg if needed.  -EXTERNAL HEMORRHOIDS: External hemorrhoids are swollen veins around the anus. We have prescribed hydrocortisone  2.5% cream to be applied twice daily for 10 days, in addition to continuing your use of Preparation H. If there is no improvement, we may consider alternative treatments or a referral to a specialist.  -DYSHIDROTIC ECZEMA OF HANDS: Dyshidrotic eczema is a type of skin condition that causes small, itchy blisters on the hands. We have prescribed triamcinolone  cream to be applied twice daily for 10 days. If there is no improvement, we may consider a stronger cream or a referral to a dermatologist. We have also provided information on alternative dermatology practices for referral.  INSTRUCTIONS: Please follow up if your right hip pain persists after 4-6 weeks for a possible x-ray. If your hand rash or hemorrhoids do not improve after 10 days of treatment, please contact us  for further evaluation or a possible referral to a specialist.  If you have any problems before your next visit feel free to message me via MyChart (minor issues or questions) or call the office, otherwise you may reach out to schedule an office visit.  Thank you! Saddie Sacks, PA-C

## 2024-07-05 NOTE — Assessment & Plan Note (Signed)
 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 - Prescribe triamcinolone  0.1% cream, apply twice daily for 10 days. - Consider stronger cream or dermatology referral if no improvement.

## 2024-07-05 NOTE — Assessment & Plan Note (Signed)
 Right hip pain likely due to inflammation or muscle strain. Bursitis unlikely given location over hip flexor, but cannot be excluded today. - Prescribe Celebrex  200 mg, take twice daily as needed with food. - Consider x-ray if pain persists after 4-6 weeks. - Provide home exercises and stretches PDF - Offer Flexeril  for nighttime use, start with 5 mg, increase to 10 mg if needed.

## 2024-07-27 ENCOUNTER — Ambulatory Visit: Admitting: Family Medicine

## 2024-07-31 ENCOUNTER — Other Ambulatory Visit: Payer: Self-pay

## 2024-07-31 DIAGNOSIS — Z130 Encounter for screening for diseases of the blood and blood-forming organs and certain disorders involving the immune mechanism: Secondary | ICD-10-CM

## 2024-08-02 ENCOUNTER — Other Ambulatory Visit

## 2024-08-02 DIAGNOSIS — Z130 Encounter for screening for diseases of the blood and blood-forming organs and certain disorders involving the immune mechanism: Secondary | ICD-10-CM

## 2024-08-03 ENCOUNTER — Ambulatory Visit: Payer: Self-pay

## 2024-08-03 LAB — COMPREHENSIVE METABOLIC PANEL WITH GFR
ALT: 34 IU/L — ABNORMAL HIGH (ref 0–32)
AST: 22 IU/L (ref 0–40)
Albumin: 4.5 g/dL (ref 3.9–4.9)
Alkaline Phosphatase: 72 IU/L (ref 41–116)
BUN/Creatinine Ratio: 23 (ref 9–23)
BUN: 16 mg/dL (ref 6–20)
Bilirubin Total: 0.5 mg/dL (ref 0.0–1.2)
CO2: 18 mmol/L — ABNORMAL LOW (ref 20–29)
Calcium: 9 mg/dL (ref 8.7–10.2)
Chloride: 101 mmol/L (ref 96–106)
Creatinine, Ser: 0.71 mg/dL (ref 0.57–1.00)
Globulin, Total: 2.7 g/dL (ref 1.5–4.5)
Glucose: 87 mg/dL (ref 70–99)
Potassium: 4.2 mmol/L (ref 3.5–5.2)
Sodium: 136 mmol/L (ref 134–144)
Total Protein: 7.2 g/dL (ref 6.0–8.5)
eGFR: 115 mL/min/1.73 (ref >59)

## 2024-08-03 LAB — CBC WITH DIFFERENTIAL/PLATELET
Basophils Absolute: 0 x10E3/uL (ref 0.0–0.2)
Basos: 1 %
EOS (ABSOLUTE): 0.1 x10E3/uL (ref 0.0–0.4)
Eos: 2 %
Hematocrit: 40.7 % (ref 34.0–46.6)
Hemoglobin: 13 g/dL (ref 11.1–15.9)
Immature Grans (Abs): 0 x10E3/uL (ref 0.0–0.1)
Immature Granulocytes: 0 %
Lymphocytes Absolute: 1.7 x10E3/uL (ref 0.7–3.1)
Lymphs: 31 %
MCH: 27.8 pg (ref 26.6–33.0)
MCHC: 31.9 g/dL (ref 31.5–35.7)
MCV: 87 fL (ref 79–97)
Monocytes Absolute: 0.5 x10E3/uL (ref 0.1–0.9)
Monocytes: 9 %
Neutrophils Absolute: 3.1 x10E3/uL (ref 1.4–7.0)
Neutrophils: 57 %
Platelets: 249 x10E3/uL (ref 150–450)
RBC: 4.67 x10E6/uL (ref 3.77–5.28)
RDW: 13.3 % (ref 11.7–15.4)
WBC: 5.5 x10E3/uL (ref 3.4–10.8)

## 2024-08-03 LAB — LIPID PANEL
Chol/HDL Ratio: 3.5 ratio (ref 0.0–4.4)
Cholesterol, Total: 163 mg/dL (ref 100–199)
HDL: 46 mg/dL (ref >39)
LDL Chol Calc (NIH): 107 mg/dL — ABNORMAL HIGH (ref 0–99)
Triglycerides: 51 mg/dL (ref 0–149)
VLDL Cholesterol Cal: 10 mg/dL (ref 5–40)

## 2024-08-03 LAB — HEMOGLOBIN A1C
Est. average glucose Bld gHb Est-mCnc: 114 mg/dL
Hgb A1c MFr Bld: 5.6 % (ref 4.8–5.6)

## 2024-08-03 LAB — TSH: TSH: 1.68 u[IU]/mL (ref 0.450–4.500)

## 2024-08-03 LAB — VITAMIN D 25 HYDROXY (VIT D DEFICIENCY, FRACTURES): Vit D, 25-Hydroxy: 39.2 ng/mL (ref 30.0–100.0)

## 2024-08-09 ENCOUNTER — Ambulatory Visit (INDEPENDENT_AMBULATORY_CARE_PROVIDER_SITE_OTHER)

## 2024-08-09 VITALS — BP 106/74 | HR 88 | Temp 97.9°F | Ht 64.0 in | Wt 169.0 lb

## 2024-08-09 DIAGNOSIS — E039 Hypothyroidism, unspecified: Secondary | ICD-10-CM | POA: Diagnosis not present

## 2024-08-09 DIAGNOSIS — F411 Generalized anxiety disorder: Secondary | ICD-10-CM

## 2024-08-09 DIAGNOSIS — E785 Hyperlipidemia, unspecified: Secondary | ICD-10-CM | POA: Insufficient documentation

## 2024-08-09 DIAGNOSIS — F53 Postpartum depression: Secondary | ICD-10-CM

## 2024-08-09 DIAGNOSIS — Z0000 Encounter for general adult medical examination without abnormal findings: Secondary | ICD-10-CM | POA: Insufficient documentation

## 2024-08-09 MED ORDER — ESCITALOPRAM OXALATE 10 MG PO TABS: 10.0000 mg | ORAL_TABLET | Freq: Every day | ORAL | 3 refills | Status: AC

## 2024-08-09 NOTE — Progress Notes (Signed)
 Complete physical exam  Patient: Betty Chung   DOB: 10/07/1991   33 y.o. Female  MRN: [id number]  Subjective:    Chief Complaint  Patient presents with   Annual Exam    History of Present Illness   Betty Chung is a 33 year old female who presents for an annual physical exam.  Musculoskeletal symptoms - Recent right hip pain resolved spontaneously without medication - Previously prescribed medication for hip pain but did not require use  Mood and psychiatric symptoms - Stable mood on Lexapro  10 mg daily  Sleep disturbances - Vivid dreams over the past week or two - Not interfering with sleep itself  - Working on improving her sleep quality as her daughter is close to sleeping through the night now  Physical activity - Walks with her daughter once daily when possible - Current exercise routine limited due to caring for a young child  Laboratory findings - Recent labs show normal blood counts, kidney function, and thyroid  function - One liver function test slightly elevated - Cholesterol panel with mildly elevated LDL - Normal hemoglobin A1c and vitamin D levels  Hypothyroidism - Takes Synthroid  25 mcg daily, monitored by OB GYN too          Most recent fall risk assessment:    08/09/2024    9:05 AM  Fall Risk   Falls in the past year? 0  Follow up Falls evaluation completed     Most recent depression screenings:    08/09/2024    9:05 AM 02/16/2024    9:02 AM  PHQ 2/9 Scores  PHQ - 2 Score 0 0  PHQ- 9 Score  2    Vision:Within last year and Dental: No current dental problems and Receives regular dental care    Patient Care Team: Gayle Saddie JULIANNA DEVONNA as PCP - General (Physician Assistant)   Outpatient Medications Prior to Visit  Medication Sig   celecoxib  (CELEBREX ) 200 MG capsule Take 1 capsule (200 mg total) by mouth 2 (two) times daily.   cyclobenzaprine  (FLEXERIL ) 10 MG tablet Take 1 tablet (10 mg total) by mouth at bedtime.    levothyroxine  (SYNTHROID ) 25 MCG tablet Take 1 tablet (25 mcg total) by mouth daily.   triamcinolone  ointment (KENALOG ) 0.1 % Apply 1 Application topically 2 (two) times daily.   [DISCONTINUED] escitalopram  (LEXAPRO ) 10 MG tablet Take 1 tablet (10 mg total) by mouth daily.   [DISCONTINUED] albuterol  (VENTOLIN  HFA) 108 (90 Base) MCG/ACT inhaler Inhale 1-2 puffs into the lungs every 6 (six) hours as needed for wheezing or shortness of breath. (Patient not taking: Reported on 07/05/2024)   [DISCONTINUED] enoxaparin  (LOVENOX ) 40 MG/0.4ML injection Inject 40 mg into the skin daily.   [DISCONTINUED] hydrocortisone  (ANUSOL -HC) 2.5 % rectal cream Place 1 Application rectally 2 (two) times daily.   [DISCONTINUED] methylergonovine (METHERGINE ) 0.2 MG tablet Take 1 tablet (0.2 mg total) by mouth 3 (three) times daily.   No facility-administered medications prior to visit.    ROS        Objective:     BP 106/74   Pulse 88   Temp 97.9 F (36.6 C) (Oral)   Ht 5' 4 (1.626 m)   Wt 169 lb (76.7 kg)   SpO2 98%   BMI 29.01 kg/m    Physical Exam Constitutional:      General: She is not in acute distress.    Appearance: Normal appearance.  Cardiovascular:     Rate and Rhythm: Normal rate and regular  rhythm.     Heart sounds: Normal heart sounds. No murmur heard.    No friction rub. No gallop.  Pulmonary:     Effort: Pulmonary effort is normal. No respiratory distress.     Breath sounds: Normal breath sounds.  Abdominal:     General: Bowel sounds are normal.  Musculoskeletal:        General: No swelling.     Cervical back: Neck supple.  Lymphadenopathy:     Cervical: No cervical adenopathy.  Skin:    General: Skin is warm and dry.  Neurological:     General: No focal deficit present.     Mental Status: She is alert.  Psychiatric:        Mood and Affect: Mood normal.        Behavior: Behavior normal.        Thought Content: Thought content normal.        No results found for  any visits on 08/09/24. Last CBC Lab Results  Component Value Date   WBC 5.5 08/02/2024   HGB 13.0 08/02/2024   HCT 40.7 08/02/2024   MCV 87 08/02/2024   MCH 27.8 08/02/2024   RDW 13.3 08/02/2024   PLT 249 08/02/2024   Last metabolic panel Lab Results  Component Value Date   GLUCOSE 87 08/02/2024   NA 136 08/02/2024   K 4.2 08/02/2024   CL 101 08/02/2024   CO2 18 (L) 08/02/2024   BUN 16 08/02/2024   CREATININE 0.71 08/02/2024   EGFR 115 08/02/2024   CALCIUM 9.0 08/02/2024   PROT 7.2 08/02/2024   ALBUMIN 4.5 08/02/2024   LABGLOB 2.7 08/02/2024   BILITOT 0.5 08/02/2024   ALKPHOS 72 08/02/2024   AST 22 08/02/2024   ALT 34 (H) 08/02/2024   Last lipids Lab Results  Component Value Date   CHOL 163 08/02/2024   HDL 46 08/02/2024   LDLCALC 107 (H) 08/02/2024   TRIG 51 08/02/2024   CHOLHDL 3.5 08/02/2024   Last hemoglobin A1c Lab Results  Component Value Date   HGBA1C 5.6 08/02/2024   Last thyroid  functions Lab Results  Component Value Date   TSH 1.680 08/02/2024   Last vitamin D Lab Results  Component Value Date   VD25OH 39.2 08/02/2024        Assessment & Plan:    Routine Health Maintenance and Physical Exam  Health Maintenance  Topic Date Due   COVID-19 Vaccine (1) Never done   HPV Vaccine (1 - Risk 3-dose SCDM series) Never done   Flu Shot  01/23/2025*   Pap with HPV screening  10/15/2026   DTaP/Tdap/Td vaccine (3 - Td or Tdap) 02/23/2034   Hepatitis C Screening  Completed   HIV Screening  Completed   Pneumococcal Vaccine  Aged Out   Meningitis B Vaccine  Aged Out   Hepatitis B Vaccine  Discontinued  *Topic was postponed. The date shown is not the original due date.    Discussed health benefits of physical activity, and encouraged her to engage in regular exercise appropriate for her age and condition.  Hypothyroidism, unspecified type Assessment & Plan: Last TSH 1.680. Hypothyroidism managed with Synthroid  25 mcg. Thyroid  function tests  normal. Management to transition from OB GYN to family practice if needed. - Continue Synthroid  25 mcg as prescribed. - Notify provider when refills are needed to transition management from OB GYN.    GAD (generalized anxiety disorder) [F41.1] Assessment & Plan: Well-controlled on Lexapro  10 mg. She reports stability and  well-being. - Send refill for Lexapro  (escitalopram ) 10 mg to pharmacy.  Orders: -     Escitalopram  Oxalate; Take 1 tablet (10 mg total) by mouth daily.  Dispense: 90 tablet; Refill: 3  Postpartum depression -     Escitalopram  Oxalate; Take 1 tablet (10 mg total) by mouth daily.  Dispense: 90 tablet; Refill: 3  Dyslipidemia Assessment & Plan: Last lipid panel: LDL 107, HDL 46, Trig 51. Mildly elevated LDL cholesterol noted. No immediate intervention required due to her young age and overall health. - Monitor LDL cholesterol levels annually. Continue with regular exercise and low fat diet to prevent worsening of LDL overtime.   Healthcare maintenance Assessment & Plan: Routine wellness visit with normal labs except slightly elevated liver function and mildly elevated LDL cholesterol. No immediate concerns. - Schedule lab appointment in 6 months to recheck liver function and thyroid  levels. - UTD on all age appropriate health screenings - Schedule next physical exam in 1 year. - Continue current health maintenance schedule, including Pap smear in 2027. - Hold off on flu shot as per her preference.     Return in about 1 year (around 08/09/2025) for Physical.     Saddie JULIANNA Sacks, PA-C

## 2024-08-09 NOTE — Assessment & Plan Note (Signed)
 Last TSH 1.680. Hypothyroidism managed with Synthroid  25 mcg. Thyroid  function tests normal. Management to transition from OB GYN to family practice if needed. - Continue Synthroid  25 mcg as prescribed. - Notify provider when refills are needed to transition management from OB GYN.

## 2024-08-09 NOTE — Assessment & Plan Note (Signed)
 Routine wellness visit with normal labs except slightly elevated liver function and mildly elevated LDL cholesterol. No immediate concerns. - Schedule lab appointment in 6 months to recheck liver function and thyroid  levels. - UTD on all age appropriate health screenings - Schedule next physical exam in 1 year. - Continue current health maintenance schedule, including Pap smear in 2027. - Hold off on flu shot as per her preference.

## 2024-08-09 NOTE — Patient Instructions (Signed)
 VISIT SUMMARY: Today, you had your annual physical exam. Your lab results were mostly normal, with a slight elevation in liver function and mildly elevated LDL cholesterol. Your mood is stable on your current medication, and your thyroid  condition is well-managed.  YOUR PLAN: ADULT WELLNESS VISIT: Routine wellness visit with normal labs except slightly elevated liver function and mildly elevated LDL cholesterol. No immediate concerns. -Schedule a lab appointment in 6 months to recheck liver function and thyroid  levels. -Schedule your next physical exam in 1 year. -Continue your current health maintenance schedule, including a Pap smear in 2027. -Hold off on the flu shot as per your preference.  MILDLY ELEVATED LDL CHOLESTEROL: Mildly elevated LDL cholesterol noted. No immediate intervention required due to your young age and overall health. -Monitor LDL cholesterol levels annually or biannually.  DEPRESSION: Depression well-controlled on Lexapro  10 mg. You report stability and well-being. -A refill for Lexapro  (escitalopram ) 10 mg has been sent to your pharmacy.  HYPOTHYROIDISM: Hypothyroidism managed with Synthroid  25 mcg. Thyroid  function tests normal. Management to transition from OB GYN to family practice if needed. -Continue Synthroid  25 mcg as prescribed. -Notify your provider when refills are needed to transition management from OB GYN.  If you have any problems before your next visit feel free to message me via MyChart (minor issues or questions) or call the office, otherwise you may reach out to schedule an office visit.  Thank you! Saddie Sacks, PA-C

## 2024-08-09 NOTE — Assessment & Plan Note (Signed)
 Well-controlled on Lexapro  10 mg. She reports stability and well-being. - Send refill for Lexapro  (escitalopram ) 10 mg to pharmacy.

## 2024-08-09 NOTE — Assessment & Plan Note (Signed)
 Last lipid panel: LDL 107, HDL 46, Trig 51. Mildly elevated LDL cholesterol noted. No immediate intervention required due to her young age and overall health. - Monitor LDL cholesterol levels annually. Continue with regular exercise and low fat diet to prevent worsening of LDL overtime.

## 2024-10-10 ENCOUNTER — Ambulatory Visit
Admission: RE | Admit: 2024-10-10 | Discharge: 2024-10-10 | Disposition: A | Discharge status: Home / Self Care | Source: Ambulatory Visit | Attending: Family Medicine | Admitting: Family Medicine

## 2024-10-10 ENCOUNTER — Inpatient Hospital Stay: Admission: RE | Admit: 2024-10-10 | Discharge: 2024-10-10

## 2024-10-10 ENCOUNTER — Other Ambulatory Visit: Payer: Self-pay

## 2024-10-10 ENCOUNTER — Encounter: Payer: Self-pay | Admitting: Emergency Medicine

## 2024-10-10 DIAGNOSIS — K648 Other hemorrhoids: Secondary | ICD-10-CM

## 2024-10-10 DIAGNOSIS — K644 Residual hemorrhoidal skin tags: Secondary | ICD-10-CM

## 2024-10-10 MED ORDER — HYDROCORT-PRAMOXINE (PERIANAL) 1-1 % EX FOAM: 1.0000 | Freq: Two times a day (BID) | CUTANEOUS | 1 refills | Status: AC

## 2024-10-10 NOTE — ED Triage Notes (Signed)
 Pt here for hemorrhoids that have been painful and intermittently bleeding x 3 weeks

## 2024-10-10 NOTE — ED Provider Notes (Signed)
 [facility]    CSN: [id number] Arrival date & time: 10/10/24  0855      History   Chief Complaint Chief Complaint  Patient presents with   Hemorrhoids    HPI Betty Chung is a 33 y.o. female.   HPI  Here for painful bleeding hemorrhoids.  They have been bothering her actually since she delivered her child in July, but has been more persistent and more painful in the last 3 weeks.  She states since she had hemorrhoid surgery sometime in 2024, before she became pregnant with this most recent pregnancy.  She has been using some Preparation H.  NKDA  Last menstrual cycle was December 2   Past Medical History:  Diagnosis Date   Anemia 03/26/2023   Anxiety    Depression    Grade I hemorrhoids 02/02/2023   History of molar pregnancy, antepartum 05/2019   Hypothyroidism    Missed ab    Multiple nevi 02/02/2023   Wears glasses     Patient Active Problem List   Diagnosis Date Noted   Dyslipidemia 08/09/2024   Healthcare maintenance 08/09/2024   Right hip pain 07/05/2024   External hemorrhoids 07/05/2024   Dyshidrotic eczema 07/05/2024   Normal labor and delivery 05/14/2024   Kidney abnormality of fetus on prenatal ultrasound 04/03/2024   Restless leg syndrome 02/16/2024   GAD (generalized anxiety disorder) 03/26/2023   Postpartum depression 03/26/2023   Hypothyroidism 04/06/2022   Antiphospholipid syndrome 12/31/2020   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    Past Surgical History:  Procedure Laterality Date   DILATION AND EVACUATION N/A 06/06/2019   Procedure: DILATATION AND EVACUATION;  Surgeon: Lilton Legions, DO;  Location: [facility];  Service: Gynecology;  Laterality: N/A;   HEMORRHOID SURGERY N/A 06/21/2023   Procedure: INTERNAL HEMORRHOIDECTOMY;  Surgeon: Sebastian Moles, MD;  Location: [facility];  Service: General;  Laterality: N/A;   HYSTEROSCOPY WITH D & C N/A 06/18/2020   Procedure:  DILATATION AND EVACUATION;  Surgeon: Lilton Legions, DO;  Location: [facility] ;  Service: Gynecology;  Laterality: N/A;   KNEE SURGERY Right 2007   mass removed from under patella (per pt benign)   WISDOM TOOTH EXTRACTION  2011    OB History     Gravida  5   Para  2   Term  2   Preterm      AB  3   Living  2      SAB  1   IAB  2   Ectopic      Multiple  0   Live Births  2        Obstetric Comments  Last pap: 10/26/16 Hx of abnormal pap: yes, in 2017, repeat normal History of STIs: denies Age at menarche: 31          Home Medications    Prior to Admission medications  Medication Sig Start Date End Date Taking? Authorizing Provider  hydrocortisone -pramoxine (PROCTOFOAM-HC) rectal foam Place 1 applicator rectally 2 (two) times daily. 10/10/24  Yes Vonna Sharlet POUR, MD  celecoxib  (CELEBREX ) 200 MG capsule Take 1 capsule (200 mg total) by mouth 2 (two) times daily. 07/05/24   Gayle Saddie FALCON, PA-C  cyclobenzaprine  (FLEXERIL ) 10 MG tablet Take 1 tablet (10 mg total) by mouth at bedtime. 07/05/24   Gayle Saddie FALCON, PA-C  escitalopram  (LEXAPRO ) 10 MG tablet Take 1 tablet (10 mg total) by mouth daily. 08/09/24   Gayle Saddie FALCON, PA-C  levothyroxine  (  SYNTHROID ) 25 MCG tablet Take 1 tablet (25 mcg total) by mouth daily. 05/16/24   Gretta Gums, MD  triamcinolone  ointment (KENALOG ) 0.1 % Apply 1 Application topically 2 (two) times daily. 07/05/24   Gayle Saddie FALCON, PA-C    Family History Family History  Problem Relation Age of Onset   Uterine cancer Paternal Grandmother    Lupus Maternal Uncle     Social History Social History[1]   Allergies   Patient has no known allergies.   Review of Systems Review of Systems   Physical Exam Triage Vital Signs ED Triage Vitals [10/10/24 0919]  Encounter Vitals Group     BP 101/66     Girls Systolic BP Percentile      Girls Diastolic BP Percentile      Boys Systolic BP Percentile      Boys Diastolic BP  Percentile      Pulse Rate 73     Resp 18     Temp 98.6 F (37 C)     Temp Source Oral     SpO2 97 %     Weight      Height      Head Circumference      Peak Flow      Pain Score 3     Pain Loc      Pain Education      Exclude from Growth Chart    No data found.  Updated Vital Signs BP 101/66 (BP Location: Left Arm)   Pulse 73   Temp 98.6 F (37 C) (Oral)   Resp 18   LMP 09/26/2024   SpO2 97%   Visual Acuity Right Eye Distance:   Left Eye Distance:   Bilateral Distance:    Right Eye Near:   Left Eye Near:    Bilateral Near:     Physical Exam Vitals reviewed.  Constitutional:      General: She is not in acute distress.    Appearance: She is not ill-appearing, toxic-appearing or diaphoretic.  Genitourinary:    Comments: Chaperone is present at the time of exam.  There is 1 small mildly tender external hemorrhoid about 1 cm in diameter anterior aspect of the rectum.  No other swelling around the external rectum. Skin:    Coloration: Skin is not jaundiced or pale.  Neurological:     General: No focal deficit present.     Mental Status: She is alert and oriented to person, place, and time.      UC Treatments / Results  Labs (all labs ordered are listed, but only abnormal results are displayed) Labs Reviewed - No data to display  EKG   Radiology No results found.  Procedures Procedures (including critical care time)  Medications Ordered in UC Medications - No data to display  Initial Impression / Assessment and Plan / UC Course  I have reviewed the triage vital signs and the nursing notes.  Pertinent labs & imaging results that were available during my care of the patient were reviewed by me and considered in my medical decision making (see chart for details).     Proctofoam was sent in to try to help the hemorrhoids improve in size.  I have asked her to please call her primary care, as she has 1 with whom she is established.  She is given  contact information for general surgery and I did a referral in case that will help.   Final Clinical Impressions(s) / UC Diagnoses   Final  diagnoses:  Internal hemorrhoids  External hemorrhoid     Discharge Instructions      Proctofoam--use 2 times daily rectally for hemorrhoids.  Please follow-up with your primary care  Please go to the emergency room if you worsen in any way, including more intense pain or abdominal pain or vomiting    I have printed a referral for surgery in case that will help you get seen.      ED Prescriptions     Medication Sig Dispense Auth. Provider   hydrocortisone -pramoxine (PROCTOFOAM-HC) rectal foam Place 1 applicator rectally 2 (two) times daily. 10 g Vonna Sharlet POUR, MD      PDMP not reviewed this encounter.    [1]  Social History Tobacco Use   Smoking status: Never    Passive exposure: Never   Smokeless tobacco: Never  Vaping Use   Vaping status: Never Used  Substance Use Topics   Alcohol use: Yes    Comment: Occassionally.   Drug use: Never     Vonna Sharlet POUR, MD 10/10/24 [phone]

## 2024-10-10 NOTE — Discharge Instructions (Addendum)
 Proctofoam--use 2 times daily rectally for hemorrhoids.  Please follow-up with your primary care  Please go to the emergency room if you worsen in any way, including more intense pain or abdominal pain or vomiting    I have printed a referral for surgery in case that will help you get seen.

## 2024-12-04 ENCOUNTER — Ambulatory Visit: Admitting: Dermatology

## 2025-02-07 ENCOUNTER — Other Ambulatory Visit

## 2025-08-10 ENCOUNTER — Encounter
